# Patient Record
Sex: Female | Born: 1955 | Race: White | Hispanic: No | Marital: Married | Smoking: Never smoker
Health system: Southern US, Community
[De-identification: ages and names within clinical notes are randomized; demographics above are authoritative.]

## PROBLEM LIST (undated history)

## (undated) DIAGNOSIS — Z860101 Personal history of adenomatous and serrated colon polyps: Secondary | ICD-10-CM

## (undated) DIAGNOSIS — Z8719 Personal history of other diseases of the digestive system: Secondary | ICD-10-CM

## (undated) DIAGNOSIS — J382 Nodules of vocal cords: Secondary | ICD-10-CM

## (undated) DIAGNOSIS — J342 Deviated nasal septum: Secondary | ICD-10-CM

## (undated) DIAGNOSIS — H40009 Preglaucoma, unspecified, unspecified eye: Secondary | ICD-10-CM

## (undated) DIAGNOSIS — I10 Essential (primary) hypertension: Secondary | ICD-10-CM

## (undated) DIAGNOSIS — N6019 Diffuse cystic mastopathy of unspecified breast: Secondary | ICD-10-CM

## (undated) DIAGNOSIS — N840 Polyp of corpus uteri: Secondary | ICD-10-CM

## (undated) DIAGNOSIS — Z86010 Personal history of colonic polyps: Secondary | ICD-10-CM

## (undated) DIAGNOSIS — H40003 Preglaucoma, unspecified, bilateral: Secondary | ICD-10-CM

## (undated) DIAGNOSIS — M1990 Unspecified osteoarthritis, unspecified site: Secondary | ICD-10-CM

## (undated) DIAGNOSIS — K219 Gastro-esophageal reflux disease without esophagitis: Secondary | ICD-10-CM

## (undated) HISTORY — PX: OTHER SURGICAL HISTORY: SHX169

## (undated) HISTORY — PX: ESOPHAGOGASTRODUODENOSCOPY: SHX1529

## (undated) HISTORY — DX: Diffuse cystic mastopathy of unspecified breast: N60.19

## (undated) HISTORY — PX: TONSILLECTOMY: SUR1361

## (undated) HISTORY — DX: Polyp of corpus uteri: N84.0

## (undated) HISTORY — PX: COLONOSCOPY: SHX174

## (undated) HISTORY — PX: NISSEN FUNDOPLICATION: SHX2091

## (undated) HISTORY — DX: Essential (primary) hypertension: I10

---

## 1998-06-17 ENCOUNTER — Other Ambulatory Visit: Admission: RE | Admit: 1998-06-17 | Discharge: 1998-06-17 | Payer: Self-pay | Admitting: Obstetrics and Gynecology

## 1999-06-22 ENCOUNTER — Other Ambulatory Visit: Admission: RE | Admit: 1999-06-22 | Discharge: 1999-06-22 | Payer: Self-pay | Admitting: Obstetrics and Gynecology

## 1999-09-16 ENCOUNTER — Ambulatory Visit (HOSPITAL_COMMUNITY): Admission: RE | Admit: 1999-09-16 | Discharge: 1999-09-16 | Payer: Self-pay | Admitting: Gastroenterology

## 2000-06-13 ENCOUNTER — Other Ambulatory Visit: Admission: RE | Admit: 2000-06-13 | Discharge: 2000-06-13 | Payer: Self-pay | Admitting: Obstetrics and Gynecology

## 2001-06-14 ENCOUNTER — Other Ambulatory Visit: Admission: RE | Admit: 2001-06-14 | Discharge: 2001-06-14 | Payer: Self-pay | Admitting: Obstetrics and Gynecology

## 2002-06-14 ENCOUNTER — Other Ambulatory Visit: Admission: RE | Admit: 2002-06-14 | Discharge: 2002-06-14 | Payer: Self-pay | Admitting: Obstetrics and Gynecology

## 2002-08-22 ENCOUNTER — Encounter: Admission: RE | Admit: 2002-08-22 | Discharge: 2002-08-22 | Payer: Self-pay | Admitting: Obstetrics and Gynecology

## 2002-08-22 ENCOUNTER — Encounter: Payer: Self-pay | Admitting: Obstetrics and Gynecology

## 2002-10-24 ENCOUNTER — Encounter: Admission: RE | Admit: 2002-10-24 | Discharge: 2002-10-24 | Payer: Self-pay | Admitting: Obstetrics and Gynecology

## 2002-12-23 ENCOUNTER — Encounter: Admission: RE | Admit: 2002-12-23 | Discharge: 2002-12-23 | Payer: Self-pay | Admitting: Obstetrics and Gynecology

## 2002-12-23 ENCOUNTER — Encounter: Payer: Self-pay | Admitting: Obstetrics and Gynecology

## 2003-06-17 ENCOUNTER — Other Ambulatory Visit: Admission: RE | Admit: 2003-06-17 | Discharge: 2003-06-17 | Payer: Self-pay | Admitting: Obstetrics and Gynecology

## 2003-07-01 ENCOUNTER — Encounter: Admission: RE | Admit: 2003-07-01 | Discharge: 2003-07-01 | Payer: Self-pay

## 2003-07-01 ENCOUNTER — Encounter: Payer: Self-pay | Admitting: Obstetrics and Gynecology

## 2003-11-04 ENCOUNTER — Other Ambulatory Visit: Admission: RE | Admit: 2003-11-04 | Discharge: 2003-11-04 | Payer: Self-pay | Admitting: Obstetrics and Gynecology

## 2003-12-22 ENCOUNTER — Encounter: Admission: RE | Admit: 2003-12-22 | Discharge: 2003-12-22 | Payer: Self-pay | Admitting: Obstetrics and Gynecology

## 2004-02-27 ENCOUNTER — Encounter: Payer: Self-pay | Admitting: Gastroenterology

## 2004-04-15 ENCOUNTER — Encounter (INDEPENDENT_AMBULATORY_CARE_PROVIDER_SITE_OTHER): Payer: Self-pay | Admitting: Specialist

## 2004-04-15 ENCOUNTER — Ambulatory Visit (HOSPITAL_BASED_OUTPATIENT_CLINIC_OR_DEPARTMENT_OTHER): Admission: RE | Admit: 2004-04-15 | Discharge: 2004-04-15 | Payer: Self-pay | Admitting: Obstetrics and Gynecology

## 2004-06-17 ENCOUNTER — Other Ambulatory Visit: Admission: RE | Admit: 2004-06-17 | Discharge: 2004-06-17 | Payer: Self-pay | Admitting: Obstetrics and Gynecology

## 2004-12-01 ENCOUNTER — Encounter: Admission: RE | Admit: 2004-12-01 | Discharge: 2004-12-01 | Payer: Self-pay | Admitting: Obstetrics and Gynecology

## 2005-03-24 ENCOUNTER — Ambulatory Visit: Payer: Self-pay | Admitting: Gastroenterology

## 2005-06-20 ENCOUNTER — Other Ambulatory Visit: Admission: RE | Admit: 2005-06-20 | Discharge: 2005-06-20 | Payer: Self-pay | Admitting: Addiction Medicine

## 2005-07-18 ENCOUNTER — Encounter: Admission: RE | Admit: 2005-07-18 | Discharge: 2005-07-18 | Payer: Self-pay | Admitting: Obstetrics and Gynecology

## 2005-12-12 ENCOUNTER — Ambulatory Visit: Payer: Self-pay | Admitting: Gastroenterology

## 2005-12-14 ENCOUNTER — Encounter: Admission: RE | Admit: 2005-12-14 | Discharge: 2005-12-14 | Payer: Self-pay | Admitting: Obstetrics and Gynecology

## 2006-06-22 ENCOUNTER — Other Ambulatory Visit: Admission: RE | Admit: 2006-06-22 | Discharge: 2006-06-22 | Payer: Self-pay | Admitting: Obstetrics and Gynecology

## 2006-07-06 ENCOUNTER — Encounter: Admission: RE | Admit: 2006-07-06 | Discharge: 2006-07-06 | Payer: Self-pay | Admitting: Obstetrics and Gynecology

## 2006-12-15 ENCOUNTER — Encounter: Admission: RE | Admit: 2006-12-15 | Discharge: 2006-12-15 | Payer: Self-pay | Admitting: Obstetrics and Gynecology

## 2007-07-04 ENCOUNTER — Other Ambulatory Visit: Admission: RE | Admit: 2007-07-04 | Discharge: 2007-07-04 | Payer: Self-pay | Admitting: Obstetrics and Gynecology

## 2007-12-25 ENCOUNTER — Encounter: Admission: RE | Admit: 2007-12-25 | Discharge: 2007-12-25 | Payer: Self-pay | Admitting: Obstetrics and Gynecology

## 2007-12-26 ENCOUNTER — Encounter: Admission: RE | Admit: 2007-12-26 | Discharge: 2007-12-26 | Payer: Self-pay | Admitting: Obstetrics and Gynecology

## 2008-07-03 ENCOUNTER — Ambulatory Visit: Payer: Self-pay | Admitting: Gastroenterology

## 2008-07-03 DIAGNOSIS — K219 Gastro-esophageal reflux disease without esophagitis: Secondary | ICD-10-CM

## 2008-07-03 DIAGNOSIS — K3189 Other diseases of stomach and duodenum: Secondary | ICD-10-CM

## 2008-07-03 DIAGNOSIS — R1013 Epigastric pain: Secondary | ICD-10-CM

## 2008-07-03 DIAGNOSIS — Z860100 Personal history of colon polyps, unspecified: Secondary | ICD-10-CM | POA: Insufficient documentation

## 2008-07-03 DIAGNOSIS — K648 Other hemorrhoids: Secondary | ICD-10-CM | POA: Insufficient documentation

## 2008-07-03 DIAGNOSIS — F411 Generalized anxiety disorder: Secondary | ICD-10-CM | POA: Insufficient documentation

## 2008-07-04 ENCOUNTER — Telehealth: Payer: Self-pay | Admitting: Gastroenterology

## 2008-07-08 ENCOUNTER — Other Ambulatory Visit: Admission: RE | Admit: 2008-07-08 | Discharge: 2008-07-08 | Payer: Self-pay | Admitting: Obstetrics and Gynecology

## 2008-11-04 ENCOUNTER — Ambulatory Visit: Payer: Self-pay | Admitting: Obstetrics and Gynecology

## 2008-12-23 ENCOUNTER — Ambulatory Visit: Payer: Self-pay | Admitting: Obstetrics and Gynecology

## 2008-12-29 ENCOUNTER — Encounter: Admission: RE | Admit: 2008-12-29 | Discharge: 2008-12-29 | Payer: Self-pay | Admitting: Obstetrics and Gynecology

## 2009-02-18 ENCOUNTER — Encounter (INDEPENDENT_AMBULATORY_CARE_PROVIDER_SITE_OTHER): Payer: Self-pay | Admitting: *Deleted

## 2009-07-09 ENCOUNTER — Encounter: Payer: Self-pay | Admitting: Obstetrics and Gynecology

## 2009-07-09 ENCOUNTER — Other Ambulatory Visit: Admission: RE | Admit: 2009-07-09 | Discharge: 2009-07-09 | Payer: Self-pay | Admitting: Obstetrics and Gynecology

## 2009-07-09 ENCOUNTER — Ambulatory Visit: Payer: Self-pay | Admitting: Obstetrics and Gynecology

## 2009-07-27 ENCOUNTER — Ambulatory Visit: Payer: Self-pay | Admitting: Obstetrics and Gynecology

## 2009-08-25 ENCOUNTER — Ambulatory Visit: Payer: Self-pay | Admitting: Obstetrics and Gynecology

## 2009-11-24 ENCOUNTER — Ambulatory Visit: Payer: Self-pay | Admitting: Obstetrics and Gynecology

## 2010-01-14 ENCOUNTER — Encounter: Admission: RE | Admit: 2010-01-14 | Discharge: 2010-01-14 | Payer: Self-pay | Admitting: Obstetrics and Gynecology

## 2010-02-23 ENCOUNTER — Ambulatory Visit: Payer: Self-pay | Admitting: Obstetrics and Gynecology

## 2010-03-10 ENCOUNTER — Ambulatory Visit: Payer: Self-pay | Admitting: Obstetrics and Gynecology

## 2010-07-09 ENCOUNTER — Telehealth: Payer: Self-pay | Admitting: Gastroenterology

## 2010-07-27 ENCOUNTER — Ambulatory Visit: Payer: Self-pay | Admitting: Obstetrics and Gynecology

## 2010-07-27 ENCOUNTER — Other Ambulatory Visit: Admission: RE | Admit: 2010-07-27 | Discharge: 2010-07-27 | Payer: Self-pay | Admitting: Obstetrics and Gynecology

## 2011-01-15 ENCOUNTER — Other Ambulatory Visit: Payer: Self-pay | Admitting: Obstetrics and Gynecology

## 2011-01-15 DIAGNOSIS — Z1239 Encounter for other screening for malignant neoplasm of breast: Secondary | ICD-10-CM

## 2011-01-15 DIAGNOSIS — Z1231 Encounter for screening mammogram for malignant neoplasm of breast: Secondary | ICD-10-CM

## 2011-01-16 ENCOUNTER — Encounter: Payer: Self-pay | Admitting: Gastroenterology

## 2011-01-16 ENCOUNTER — Encounter: Payer: Self-pay | Admitting: Obstetrics and Gynecology

## 2011-01-25 NOTE — Progress Notes (Signed)
Summary: Schedule Colonoscopy or ECL  Phone Note Outgoing Call Call back at [facility] Phone [phone]   Call placed by: Harlow Mares CMA Duncan Dull),  July 09, 2010 3:02 PM Call placed to: Patient Summary of Call: Left message on patients machine to call back, she cxed her ECL 06/2008. she is actully due for just a colon but it looks like she was approved for a ECL by [employer].  Initial call taken by: Harlow Mares CMA Duncan Dull),  July 09, 2010 3:04 PM  Follow-up for Phone Call        Left a message on the patient machine to call back and schedule a previsit and procedure with our office. A letter will be mailed to the patient.   Follow-up by: Harlow Mares CMA Duncan Dull),  July 16, 2010 4:32 PM

## 2011-02-02 ENCOUNTER — Ambulatory Visit
Admission: RE | Admit: 2011-02-02 | Discharge: 2011-02-02 | Disposition: A | Discharge status: Home / Self Care | Payer: BC Managed Care – PPO | Source: Ambulatory Visit | Attending: Obstetrics and Gynecology | Admitting: Obstetrics and Gynecology

## 2011-02-02 DIAGNOSIS — Z1231 Encounter for screening mammogram for malignant neoplasm of breast: Secondary | ICD-10-CM

## 2011-05-13 NOTE — Op Note (Signed)
NAME:  [name], [name]                          ACCOUNT NO.:  [id number]   MEDICAL RECORD NO.:  [id number]                   PATIENT TYPE:  AMB   LOCATION:  [facility]                                 FACILITY:  [facility]   PHYSICIAN:  Daniel L. Eda Paschal, M.D.           DATE OF BIRTH:  1956/10/19   DATE OF PROCEDURE:  04/15/2004  DATE OF DISCHARGE:                                 OPERATIVE REPORT   PREOPERATIVE DIAGNOSES:  Dysfunctional uterine bleeding, endometrial polyp.   POSTOPERATIVE DIAGNOSES:  Dysfunctional uterine bleeding, endometrial polyp.   OPERATION:  D&C hysteroscopy.   SURGEON:  Daniel L. Eda Paschal, M.D.   ANESTHESIA:  General.   INDICATIONS FOR PROCEDURE:  The patient is a 55 year old, gravida 2, para 2,  AB 0 with a 6-9 month history of menometrorrhagia. Saline infusion  hysterogram done in the office showed intrauterine cavity defects consistent  with endometrial polyp. As a result of this, she enters the hospital for  hysteroscopy, D&C and excision of polyp.   FINDINGS:  External and vaginal is normal, cervix is clean. The uterus is  retroverted, normal size and shape. There is first degree uterine descensus.  Adnexa are palpably normal.  At the time of hysteroscopy, the patient had  several small endometrial polyps coming off both lateral walls of the  fundus.  After they had been removed, the top of the fundus, tubal ostia,  anterior and posterior walls of the fundus, lower uterine segment and  endocervical canal were free of disease.   DESCRIPTION OF PROCEDURE:  After adequate general anesthesia, the patient  was placed in the dorsal lithotomy position, prepped and draped in the usual  sterile manner. A single tooth tenaculum was placed in the anterior lip of  the cervix, the cervix was dilated to a #31 Pratt dilator and then a  hysteroscopic examination was done using the operative hysteroscope using a  wire loop using 3% sorbitol to expand the intrauterine cavity.  A camera was  used for magnification. When we first entered the uterus, there was so much  artifact from the endometrial polyps and endometrium that I really could not  get a very good view to resect the polyps under direct visualization so the  hysteroscope was removed and a D&C was done with removal of several very  large endometrial polyps as well as endometrial curettings. They were sent  to pathology for tissue diagnosis. The patient was now rehysteroscoped. She  now had a clean cavity and therefore it was felt that all the pathology had  been removed. Pictures were taken after removing the polyps for  documentation. The procedure was terminated, fluid deficit was less than 100  mL, blood loss was less than 50 mL.  The patient left the operating room in  satisfactory condition.  Daniel L. Eda Paschal, M.D.    [name]  D:  04/15/2004  T:  04/16/2004  Job:  [id number]

## 2011-08-22 DIAGNOSIS — K635 Polyp of colon: Secondary | ICD-10-CM | POA: Insufficient documentation

## 2011-08-22 DIAGNOSIS — N840 Polyp of corpus uteri: Secondary | ICD-10-CM | POA: Insufficient documentation

## 2011-08-22 DIAGNOSIS — H409 Unspecified glaucoma: Secondary | ICD-10-CM | POA: Insufficient documentation

## 2011-08-31 ENCOUNTER — Encounter: Payer: BC Managed Care – PPO | Admitting: Obstetrics and Gynecology

## 2011-09-14 ENCOUNTER — Other Ambulatory Visit: Payer: Self-pay | Admitting: Dermatology

## 2011-09-23 ENCOUNTER — Other Ambulatory Visit: Payer: Self-pay | Admitting: *Deleted

## 2011-09-23 MED ORDER — ESTRADIOL 10 MCG VA TABS: ORAL_TABLET | VAGINAL | Status: DC

## 2011-10-04 ENCOUNTER — Encounter: Payer: Self-pay | Admitting: Obstetrics and Gynecology

## 2011-10-04 ENCOUNTER — Ambulatory Visit (INDEPENDENT_AMBULATORY_CARE_PROVIDER_SITE_OTHER): Payer: BC Managed Care – PPO | Admitting: Obstetrics and Gynecology

## 2011-10-04 ENCOUNTER — Other Ambulatory Visit (HOSPITAL_COMMUNITY)
Admission: RE | Admit: 2011-10-04 | Discharge: 2011-10-04 | Disposition: A | Discharge status: Home / Self Care | Payer: BC Managed Care – PPO | Source: Ambulatory Visit | Attending: Obstetrics and Gynecology | Admitting: Obstetrics and Gynecology

## 2011-10-04 VITALS — BP 120/76 | Ht 65.0 in | Wt 129.0 lb

## 2011-10-04 DIAGNOSIS — R82998 Other abnormal findings in urine: Secondary | ICD-10-CM

## 2011-10-04 DIAGNOSIS — N6019 Diffuse cystic mastopathy of unspecified breast: Secondary | ICD-10-CM | POA: Insufficient documentation

## 2011-10-04 DIAGNOSIS — Z01419 Encounter for gynecological examination (general) (routine) without abnormal findings: Secondary | ICD-10-CM | POA: Insufficient documentation

## 2011-10-04 DIAGNOSIS — N951 Menopausal and female climacteric states: Secondary | ICD-10-CM

## 2011-10-04 DIAGNOSIS — R1032 Left lower quadrant pain: Secondary | ICD-10-CM

## 2011-10-04 DIAGNOSIS — M810 Age-related osteoporosis without current pathological fracture: Secondary | ICD-10-CM

## 2011-10-04 DIAGNOSIS — N952 Postmenopausal atrophic vaginitis: Secondary | ICD-10-CM

## 2011-10-04 DIAGNOSIS — Z780 Asymptomatic menopausal state: Secondary | ICD-10-CM

## 2011-10-04 MED ORDER — ALPRAZOLAM 0.25 MG PO TABS: 0.2500 mg | ORAL_TABLET | Freq: Every evening | ORAL | Status: AC | PRN

## 2011-10-04 NOTE — Progress Notes (Signed)
Patient came today for her annual GYN exam. She says her Vagifem is doing very well. She is not taking HRT and is all right without it. She has osteoporosis and has been on by biphosphonates since July, 2008. She is up-to-date on mammograms. She is due for a followup bone density to be sure she continues to respond to her medication. She does her lab through her PCP.  Physical examination: HEENT within normal limits. Neck: Thyroid not large. No masses. Supraclavicular nodes: not enlarged. Breasts: Examined in both sitting midline position. No skin changes and no masses. Abdomen: Soft no guarding rebound or masses or hernia. Pelvic: External: Within normal limits. BUS: Within normal limits. Vaginal:within normal limits. Good estrogen effect. No evidence of cystocele rectocele or enterocele. Cervix: clean. Uterus: Normal size and shape. Adnexa: No masses. Rectovaginal exam: Confirmatory and negative. Extremities: Within normal limits.  Assessment: #1. Atrophic vaginitis #2. Osteoporosis  Plan: Continue Boniva and Vagifem. Bone density. Continue yearly mammograms. Continue alprazolam prescription written.

## 2011-10-13 ENCOUNTER — Ambulatory Visit (INDEPENDENT_AMBULATORY_CARE_PROVIDER_SITE_OTHER): Payer: BC Managed Care – PPO

## 2011-10-13 DIAGNOSIS — M810 Age-related osteoporosis without current pathological fracture: Secondary | ICD-10-CM

## 2011-10-18 ENCOUNTER — Other Ambulatory Visit: Payer: Self-pay | Admitting: *Deleted

## 2011-10-18 MED ORDER — IBANDRONATE SODIUM 150 MG PO TABS: 150.0000 mg | ORAL_TABLET | ORAL | Status: DC

## 2011-10-20 ENCOUNTER — Ambulatory Visit (INDEPENDENT_AMBULATORY_CARE_PROVIDER_SITE_OTHER): Payer: BC Managed Care – PPO | Admitting: Obstetrics and Gynecology

## 2011-10-20 ENCOUNTER — Ambulatory Visit (INDEPENDENT_AMBULATORY_CARE_PROVIDER_SITE_OTHER): Payer: BC Managed Care – PPO

## 2011-10-20 DIAGNOSIS — R1032 Left lower quadrant pain: Secondary | ICD-10-CM

## 2011-10-20 DIAGNOSIS — M810 Age-related osteoporosis without current pathological fracture: Secondary | ICD-10-CM

## 2011-10-20 DIAGNOSIS — M899 Disorder of bone, unspecified: Secondary | ICD-10-CM

## 2011-10-20 DIAGNOSIS — M8580 Other specified disorders of bone density and structure, unspecified site: Secondary | ICD-10-CM

## 2011-10-20 DIAGNOSIS — L293 Anogenital pruritus, unspecified: Secondary | ICD-10-CM

## 2011-10-20 DIAGNOSIS — N898 Other specified noninflammatory disorders of vagina: Secondary | ICD-10-CM

## 2011-10-20 DIAGNOSIS — N6019 Diffuse cystic mastopathy of unspecified breast: Secondary | ICD-10-CM

## 2011-10-20 MED ORDER — IBANDRONATE SODIUM 150 MG PO TABS: 150.0000 mg | ORAL_TABLET | ORAL | Status: AC

## 2011-10-20 MED ORDER — FLUCONAZOLE 200 MG PO TABS: 200.0000 mg | ORAL_TABLET | Freq: Every day | ORAL | Status: AC

## 2011-10-20 NOTE — Progress Notes (Signed)
The patient came in today for a multi-problem visit. The first problem was that she's had intermittent left lower quadrant pain. She says it can come monthly or every other month and feels like she is ovulating. She is having no vaginal bleeding with it. She has a history of ovarian cysts. She is having no nausea vomiting or change in her bowel habits. She is having no dysuria frequency or hematuria.  Assessment: Left lower quadrant pain  Plan: Pelvic ultrasound done. Patient's uterus is anteverted and has a heterogeneous echo pattern consistent with adenomyosis. Her right ovary is normal. Her left ovary is normal. Her cul-de-sac is free of fluid. She was reassured and we will monitor this pain.  The second problem was that she's had some vulvar and vaginal itching and wanted to she had a yeast infection. She's also had some vaginal discharge.  Pelvic exam: External: Within normal limits. BUS: Within normal limits. Vaginal exam: Patient has decreased estrogen effect. Wet prep positive for yeast.  Assessment: Yeast vaginitis  Plan: Diflucan 200 mg daily for 3 days.  The third problem is that patient is very worried about her breasts because of a breast cyst it's been watched on mammography. She is felt nothing herself on self-exam. She did not think I examine her breasts lying down but just  sitting at her recent annual exam. I reassured that I examined her in both positions. However we went ahead and did a breast exam. Kennon Portela was present both for the breast exam and the pelvic exam.both her breasts were carefully examined in both the sitting and lying position. There are no skin changes. There no dominant lesions.  Assessment: Fibrocystic breast disease  Plan: Patient reassured that she is a normal breast exam.  Her fourth problem is that her recent bone density here showed significant loss of bone in both the spine and hip. She does have osteoporosis. She was originally treated with  Actonel. She was which by her insurance company to [employer]. We discussed that I thought Actonel was better coverage. We will check her for secondary causes a bone loss. We discussed either switch her back to Actonel or using Prolia or Reclast. We will make that decision after we see her lab work.

## 2011-10-21 LAB — GLIA (IGA/G) + TTG IGA: Gliadin IgA: 8.6 U/mL (ref <20)

## 2011-10-26 ENCOUNTER — Other Ambulatory Visit: Payer: Self-pay | Admitting: *Deleted

## 2011-10-26 DIAGNOSIS — M810 Age-related osteoporosis without current pathological fracture: Secondary | ICD-10-CM

## 2011-10-27 LAB — CALCIUM, URINE, 24 HOUR: Calcium, Ur: 3 mg/dL

## 2011-10-27 LAB — CREATININE, URINE, 24 HOUR: Creatinine, 24H Ur: 1030 mg/d (ref 700–1800)

## 2011-11-11 ENCOUNTER — Other Ambulatory Visit: Payer: Self-pay | Admitting: *Deleted

## 2011-11-11 DIAGNOSIS — M810 Age-related osteoporosis without current pathological fracture: Secondary | ICD-10-CM

## 2011-11-11 MED ORDER — DENOSUMAB 60 MG/ML ~~LOC~~ SOLN
60.0000 mg | Freq: Once | SUBCUTANEOUS | Status: AC
  Administered 2011-11-30: 60 mg via SUBCUTANEOUS

## 2011-11-11 NOTE — Progress Notes (Signed)
Patient informed benefits for Prolia.  She would have a $70 copay.  Will order and have patient come in for injection on Dec 5th @2 :30pm.

## 2011-11-14 ENCOUNTER — Telehealth: Payer: Self-pay | Admitting: *Deleted

## 2011-11-14 NOTE — Telephone Encounter (Signed)
Patient was informed Prolia benefits are $70 copay.  Ordered Prolia and set up appointment to inject.

## 2011-11-14 NOTE — Telephone Encounter (Signed)
Message copied by Libby Maw on Mon Nov 14, 2011  2:10 PM ------      Message from: Trellis Paganini      Created: Thu Oct 27, 2011  9:02 AM       I send you a result note on the above patient. Please get her approved for Prolia. She has osteoporosis and has been losing significant bone on Boniva. Thank you.

## 2011-11-30 ENCOUNTER — Ambulatory Visit (INDEPENDENT_AMBULATORY_CARE_PROVIDER_SITE_OTHER): Payer: BC Managed Care – PPO | Admitting: Anesthesiology

## 2011-11-30 DIAGNOSIS — M810 Age-related osteoporosis without current pathological fracture: Secondary | ICD-10-CM

## 2012-02-02 ENCOUNTER — Other Ambulatory Visit: Payer: Self-pay | Admitting: Obstetrics and Gynecology

## 2012-02-02 DIAGNOSIS — Z1231 Encounter for screening mammogram for malignant neoplasm of breast: Secondary | ICD-10-CM

## 2012-02-09 ENCOUNTER — Ambulatory Visit (INDEPENDENT_AMBULATORY_CARE_PROVIDER_SITE_OTHER): Payer: BC Managed Care – PPO | Admitting: Obstetrics and Gynecology

## 2012-02-09 DIAGNOSIS — N898 Other specified noninflammatory disorders of vagina: Secondary | ICD-10-CM

## 2012-02-09 DIAGNOSIS — N760 Acute vaginitis: Secondary | ICD-10-CM

## 2012-02-09 DIAGNOSIS — B9689 Other specified bacterial agents as the cause of diseases classified elsewhere: Secondary | ICD-10-CM

## 2012-02-09 DIAGNOSIS — A499 Bacterial infection, unspecified: Secondary | ICD-10-CM

## 2012-02-09 LAB — WET PREP FOR TRICH, YEAST, CLUE: Clue Cells Wet Prep HPF POC: NONE SEEN

## 2012-02-09 MED ORDER — ESTRADIOL 10 MCG VA TABS: ORAL_TABLET | VAGINAL | Status: DC

## 2012-02-09 MED ORDER — METRONIDAZOLE 0.75 % VA GEL: 1.0000 | Freq: Every day | VAGINAL | Status: AC

## 2012-02-09 NOTE — Progress Notes (Signed)
Patient came in today with a 3 week history of vaginal discharge with a little itching and odor. She tried 3 Diflucan without success. She continues use Vagifem and has to use it more frequently to get good results. She needs a refill.  Exam: Kennon Portela present. External: No evidence of vulvitis. BUS: Within normal limits. Vaginal exam: Frothy discharge present. Cervix: Within normal limits. Wet prep negative for yeast, Trichomonas, clue cells. Many white blood cells and bacteria present.  Assessment: Bacterial vaginosis  Plan: MetroGel vaginal cream one applicatorful at bedtime in her vagina for 5 days. Told her to call me if not completely gone and we may add Gynazole 1 cream. Vagifem refilled.

## 2012-03-01 ENCOUNTER — Ambulatory Visit
Admission: RE | Admit: 2012-03-01 | Discharge: 2012-03-01 | Disposition: A | Discharge status: Home / Self Care | Payer: BC Managed Care – PPO | Source: Ambulatory Visit | Attending: Obstetrics and Gynecology | Admitting: Obstetrics and Gynecology

## 2012-03-01 DIAGNOSIS — Z1231 Encounter for screening mammogram for malignant neoplasm of breast: Secondary | ICD-10-CM

## 2012-03-06 ENCOUNTER — Other Ambulatory Visit: Payer: Self-pay | Admitting: Internal Medicine

## 2012-03-06 DIAGNOSIS — M5412 Radiculopathy, cervical region: Secondary | ICD-10-CM

## 2012-03-11 ENCOUNTER — Other Ambulatory Visit: Payer: BC Managed Care – PPO

## 2012-04-11 ENCOUNTER — Ambulatory Visit (HOSPITAL_COMMUNITY)
Admission: RE | Admit: 2012-04-11 | Discharge: 2012-04-11 | Disposition: A | Discharge status: Home / Self Care | Payer: BC Managed Care – PPO | Source: Ambulatory Visit | Attending: Neurosurgery | Admitting: Neurosurgery

## 2012-04-11 DIAGNOSIS — M542 Cervicalgia: Secondary | ICD-10-CM | POA: Insufficient documentation

## 2012-04-11 DIAGNOSIS — IMO0001 Reserved for inherently not codable concepts without codable children: Secondary | ICD-10-CM | POA: Insufficient documentation

## 2012-04-11 DIAGNOSIS — M6281 Muscle weakness (generalized): Secondary | ICD-10-CM | POA: Insufficient documentation

## 2012-04-12 DIAGNOSIS — M542 Cervicalgia: Secondary | ICD-10-CM | POA: Insufficient documentation

## 2012-04-12 NOTE — Evaluation (Signed)
Physical Therapy Evaluation  Patient Details  Name: [name] MRN: [id number] Date of Birth: [birth year]  Today's Date: 04/11/2012 Time: 7829-5621 Time Calculation (min): 50 min Charges: 1 eval Visit#: 1  of 8   Re-eval: 05/12/12    Past Medical History:  Past Medical History  Diagnosis Date  . Endometrial polyp   . Osteoporosis   . Colon polyps     Benign  . Hypertension   . Breast fibrocystic disorder    Past Surgical History:  Past Surgical History  Procedure Date  . Hysteroscopy 2005  . Dilation and curettage of uterus 2005  . Nasal septum surgery   . Tonsillectomy   . Cesarean section     X 2  . Nissen fundoplication     Subjective Symptoms/Limitations Symptoms: Pt reports that the week of Feb 17th she was participating in her workout activities and was cleaning her home intensely for an entire week to get ready for a party when she started to notice sharp stabbing pains to the back of her neck. She went to her PCP (Dr. Ouida Sills) who prescribed muscle relaxors and pain relievers which helped some. She was then referred to Dr. Dutch Quint who performed an MRI and found she has increased spuring at C5-6 w/disc degeneration and foraminal narrowing. She reports after she sought help from Dr. Dutch Quint and felt comfortable to start walking again her neck pain started to decrease, however she continues to have increased numbness and tingling to her LUE which is felt the worst into her hand. She denies recent HA, however reports that 2 years ago she sought help from a chiropractor to help with HA and feels that some of the manipulation may have led to her current state. PMH: Osteoporosis  How long can you sit comfortably?: Increased numbness, especially with driving How long can you walk comfortably?: Increased pain when she is at her body flex class Special Tests: + Distraction, + Spurlings compression  Pain Assessment Currently in Pain?: Yes Pain Score:   4 (more complaints of  tingling and numbness) Pain Location: Neck   04/11/12 1408 Assessment Diagnosis Cervical Pain Next MD Visit Dr. 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  However when seated demonstrates mild Bilateral scapular dyskinesis  Cervical AROM Flexion   WNL, has increased pain Extension  decreased 10% (w/L SB has increased numbness) Right Side Bend Decreased 40% Left Side Bend Decreased 40% Right Rotation  WNL Left Rotation  Decreased 25% Cervical Strength Flexion   3+/5 Extension  4/5 Right Side Bend 4/5 Left Side Bend 4/5 Right Rotation  5/5 Left Rotation  4/5  Palpation: increaed pain and tenderness to L UT region with signficant trigger point.  Increaed UT activiation with initiation of shoulder movement    Exercise/Treatments Neck Exercises Neck Retraction: 5 reps (10 sec holds) Shoulder Extension: Limitations Shoulder Extension Limitations: Scap retraction 5x10 sec holds  Manual Therapy Manual Therapy: Other (comment) Other Manual Therapy: Manual cervical traction 3x1 minute holds - reports decreased numbness and tingling  Physical Therapy Assessment and Plan PT Assessment and Plan Clinical Impression Statement: Pt is a 56 year old female referred to PT secondary to cervicalgia.  After examination it was  found she has current impairments including decreased cervical ROM, increased L cervical tingling, numbness and intermittent pain, decreased cervical strength, cervical and scapular spasms, poor motor control to cervical stabilizers, mild upper cross syndrome and reports of decreased exercise activities secondary to increased pain.  Pt will  benefit from skilled OP PT in order to address above impairments in order to reach functional goals.  Rehab Potential: Good PT Frequency: Min 2X/week PT Duration: 4 weeks PT Treatment/Interventions: Functional mobility training;Therapeutic activities;Therapeutic exercise;Neuromuscular re-education;Patient/family education;Other (comment) (Manual techniques, vertebral joint mobs, modalities for pain) PT Plan: attempt cervical traction static hold starting at 12-13 pounds.  focus on cervical core stability after cervical traction and decreasing pain.     Goals Home Exercise Program Pt will Perform Home Exercise Program: Independently PT Short Term Goals Time to Complete Short Term Goals: 4 weeks PT Short Term Goal 1: Pt will improve her cervical strength and stability in order to tolerate attending her power flex class PT Short Term Goal 2: Pt will improve her cervical AROM to WNL w/o reports of increased numbness and tingling. PT Short Term Goal 3: Pt will report pain less than 2/10 for 75% of her day for improved QOL. PT Short Term Goal 4: Pt will present with decreased muscular spasms to UT and L scapular region.   Problem List Patient Active Problem List  Diagnoses  . ANXIETY STATE, UNSPECIFIED  . INTERNAL HEMORRHOIDS  . ESOPHAGEAL REFLUX  . DYSPEPSIA  . COLONIC POLYPS, ADENOMATOUS, HX OF  . Endometrial polyp  . Osteoporosis  . Colon polyps  . Glaucoma  . Breast fibrocystic disorder  . Cervicalgia    PT Plan of Care PT Home Exercise Plan: chin tucks with scap retraction for posture Consulted and Agree with Plan of Care: Patient   Annett Fabian, PT 04/12/2012, 1:17 PM  Physician Documentation Your signature is required to indicate approval of the treatment plan as stated above.  Please sign and either send electronically or make a copy of this report for your files and return this physician signed original.   Please mark one 1.__approve of plan  2. ___approve of plan with the  following conditions.   ______________________________                                                          _____________________ Physician Signature                                                                                                             Date

## 2012-04-17 ENCOUNTER — Ambulatory Visit (HOSPITAL_COMMUNITY)
Admission: RE | Admit: 2012-04-17 | Discharge: 2012-04-17 | Disposition: A | Discharge status: Home / Self Care | Payer: BC Managed Care – PPO | Source: Ambulatory Visit | Attending: Physical Therapy | Admitting: Physical Therapy

## 2012-04-17 NOTE — Progress Notes (Signed)
Physical Therapy Treatment Patient Details  Name: [name] MRN: [id number] Date of Birth: 1956/12/02  Today's Date: 04/17/2012 Time: 2130-8657 Time Calculation (min): 55 min Charges:1 traction, 15' Manual,  8' TE Visit#: 2  of 8   Re-eval: 05/12/12    Subjective: Symptoms/Limitations Symptoms: Pt reports that she has been having some tingling with chin tucks, but not with scap retractions. Reports she just got back from a beach vacation and notices weakness when lifting her luggage in her L arm.  Pain Assessment Currently in Pain?: No/denies Pain Score:  (Has tingling to her hand)  Exercise/Treatments Neck Exercises Neck Retraction:  (Held secondary to increased tingling) Shoulder Extension Limitations: Scap retraction 5x10 sec holds; Green theraputy squeeze 3x, Red rubber band: 5 finger extension x10; each finger opposition x5 each finger Additional Neck Exercises UBE (Upper Arm Bike): 4 minutes backwards w/cueing for proper posture  Modalities Modalities: Traction Manual Therapy Manual Therapy: Other (comment) Other Manual Therapy: Nerve glides with focus on L shoulder depression: with shoulder abduction, elbow flexion and extension, pronation and supination and head turing to the R.  Initally numbness into finger tips. At the end numbness to proximal volar surface of hand near wrist. Traction Type of Traction: Cervical Min (lbs): 15 minutes Max (lbs): 13 lbs Hold Time: Static x15 minutes  Physical Therapy Assessment and Plan PT Assessment and Plan Clinical Impression Statement: Today's treatment focus was to decrease tingling to LUEand improve posture and hand strength.  Reports decreased numbness after traction and manual techniques were performed while in the seated position. PT Plan: f/u on traction and nerve glides.    Goals    Problem List Patient Active Problem List  Diagnoses  . ANXIETY STATE, UNSPECIFIED  . INTERNAL HEMORRHOIDS  . ESOPHAGEAL REFLUX    . DYSPEPSIA  . COLONIC POLYPS, ADENOMATOUS, HX OF  . Endometrial polyp  . Osteoporosis  . Colon polyps  . Glaucoma  . Breast fibrocystic disorder  . Cervicalgia    PT Plan of Care PT Home Exercise Plan: Gave pt green putty and red rubber band for HEP (finger flexion and grip strength) Consulted and Agree with Plan of Care: Patient  GP No functional reporting required  [name] 04/17/2012, 12:16 PM

## 2012-04-19 ENCOUNTER — Ambulatory Visit (HOSPITAL_COMMUNITY)
Admission: RE | Admit: 2012-04-19 | Discharge: 2012-04-19 | Disposition: A | Discharge status: Home / Self Care | Payer: BC Managed Care – PPO | Source: Ambulatory Visit | Attending: Internal Medicine | Admitting: Internal Medicine

## 2012-04-23 NOTE — Progress Notes (Signed)
Physical Therapy Treatment Patient Details  Name: [name] MRN: [id number] Date of Birth: 1956-03-14  Today's Date: 04/23/2012 Time: 4098-1191 Time Calculation (min): 40 min Charges:  Visit#: 3  of 8   Re-eval: 05/12/12    Subjective: Symptoms/Limitations Symptoms: Pt states that she is not having any pain just numbness in L hand. She states she could not get comfortable last night lying on either side Pain Assessment Currently in Pain?: No/denies  Precautions/Restrictions     Exercise/Treatments Education: on proper body mechanics for gardening, house cleaning, washing dishes. Explained importance of muscle structures and appropriate mechanics and posture that does not cause increased radicular pain.  Modalities Modalities: Traction Traction Type of Traction: Cervical Min (lbs): 0 Max (lbs): 13 Hold Time: Static Time: 15  Physical Therapy Assessment and Plan PT Assessment and Plan Clinical Impression Statement: Pt had decreased nerve pain after cervical traction and did not require nerve glides.  Most of the treatment consisted of education pt in the proper position for gardening, house cleaning with scap retraction and chin tucks to the point that they do not cause radicular pain.  Pt continues to have some radicular pain with chin tucks at end range.   PT Plan: Cont to improve cervical stability through exercise    Goals    Problem List Patient Active Problem List  Diagnoses  . ANXIETY STATE, UNSPECIFIED  . INTERNAL HEMORRHOIDS  . ESOPHAGEAL REFLUX  . DYSPEPSIA  . COLONIC POLYPS, ADENOMATOUS, HX OF  . Endometrial polyp  . Osteoporosis  . Colon polyps  . Glaucoma  . Breast fibrocystic disorder  . Cervicalgia       GP No functional reporting required  Jaisha Villacres 04/23/2012, 1:38 PM

## 2012-04-24 ENCOUNTER — Ambulatory Visit (HOSPITAL_COMMUNITY)
Admission: RE | Admit: 2012-04-24 | Discharge: 2012-04-24 | Disposition: A | Discharge status: Home / Self Care | Payer: BC Managed Care – PPO | Source: Ambulatory Visit | Attending: Internal Medicine | Admitting: Internal Medicine

## 2012-04-24 NOTE — Progress Notes (Signed)
Physical Therapy Treatment Patient Details  Name: [name] MRN: [id number] Date of Birth: 29-Nov-1956  Today's Date: 04/24/2012 Time: 1118-1205 Time Calculation (min): 47 min Charges: 15 TE, 33' Self Care Visit#: 4  of 8   Re-eval: 05/12/12    Subjective: Symptoms/Limitations Symptoms: Pt reports that she continues to report that she has intermittent numbness in her L shoulder, c/co is numbness to her L hand.  Pain Assessment Currently in Pain?: No/denies  Precautions/Restrictions     Exercise/Treatments Other Exercises: Cervical Retraction and Scap retraction: W/all ther-ex today when discussing activities she performs at body flex.  Squats 3# weights, Lunges 3# weights, Chest press 3# dowel, Flies 3# weights, supine shoulder flexion 3# weights, Quadruped w/elbows on 6 in step hip extension w/L hand towel squeeze (BLE); supine cruches, pilates 100's, standing quick shoulder flexion, abduction and extension.  Mod Cueing for proper position for activities Other Seated Exercises: Hand Exercises: Red Hand Squeeze (each finger 5x5 sec holds); Red Gripper full hand grip 5x5 sec hold; Green Putty: Key Turns x3 minutes, Composit grip x10, finger flexion 5x5 sec holds ROM / Strengthening / Isometric Strengthening UBE (Upper Arm Bike): 4 min backwards for posture   Physical Therapy Assessment and Plan PT Assessment and Plan Clinical Impression Statement: Today's treatment focus was to discuss proper posture with body flex activities and pt would demonstrate between 3-15 reps of each as well as education on proper ways to avoid radiating pain.  Also provided pt w/increased hand strengthening exercises.  Pt brought in home pillow to discuss proper body position and alignment. Continues to requrie mod cueing for proper alignment and techniques to decrease radicular pain.  PT Plan: Possible D/C after next visit    Goals    Problem List Patient Active Problem List  Diagnoses  . ANXIETY  STATE, UNSPECIFIED  . INTERNAL HEMORRHOIDS  . ESOPHAGEAL REFLUX  . DYSPEPSIA  . COLONIC POLYPS, ADENOMATOUS, HX OF  . Endometrial polyp  . Osteoporosis  . Colon polyps  . Glaucoma  . Breast fibrocystic disorder  . Cervicalgia    PT - End of Session Activity Tolerance: Patient tolerated treatment well PT Plan of Care PT Patient Instructions: Thorough education on proper neck posture with sleeping, and body flex activities.  Recommend 6 inch prop to use w/POE activities at her work out class to improve her overall posture Consulted and Agree with Plan of Care: Patient  GP No functional reporting required  [name] 04/24/2012, 12:23 PM

## 2012-04-26 ENCOUNTER — Ambulatory Visit (HOSPITAL_COMMUNITY)
Admission: RE | Admit: 2012-04-26 | Discharge: 2012-04-26 | Disposition: A | Discharge status: Home / Self Care | Payer: BC Managed Care – PPO | Source: Ambulatory Visit | Attending: Internal Medicine | Admitting: Internal Medicine

## 2012-04-26 DIAGNOSIS — IMO0001 Reserved for inherently not codable concepts without codable children: Secondary | ICD-10-CM | POA: Insufficient documentation

## 2012-04-26 DIAGNOSIS — M542 Cervicalgia: Secondary | ICD-10-CM | POA: Insufficient documentation

## 2012-04-26 DIAGNOSIS — M6281 Muscle weakness (generalized): Secondary | ICD-10-CM | POA: Insufficient documentation

## 2012-04-27 NOTE — Progress Notes (Addendum)
Physical Therapy Treatment Patient Details  Name: [name] MRN: [id number] Date of Birth: 1956-03-27  Today's Date: 04/27/2012 Time: 0950-1035 PT Time Calculation (min): 45 min Charges: 30' Manual, 15' Self Care Visit#: 5  of 8   Re-eval: 05/12/12  Subjective: Symptoms/Limitations Symptoms: Pt states she continues to have numbness to her arm, but is able to control it.   Precautions/Restrictions     Exercise/Treatments Neck Exercises Neck Retraction: 10 reps;Supine;Other (comment) (w/alt shoulder flex x10 each) Neck Lateral Flexion - Right: AROM;5 reps Neck Lateral Flexion - Left: AROM;5 reps Neck Rotation - Right: AROM;5 reps Neck Rotation - Left: AROM;5 reps Scapular Retraction: Both;10 reps;Seated (w.chin tuck) Additional Neck Exercises    Manual Therapy Manual Therapy: Joint mobilization Joint Mobilization: Grade II-IV PA to C5-7 Other Manual Therapy: Manaual cervical traction w/joint mobs in supine position; L UE PROM shoulder flexion and abudction w/nerve glides to shoulder only  Physical Therapy Assessment and Plan PT Assessment and Plan Clinical Impression Statement: Treatment focused on manual techniques to rid pt of radicular pain with seated chin tucks.  Beginning of session pt had immediate radiculopathy with cervical extension, at end of treatment it took 17 secs for radiculopathy to appear w.cervical extension.  Pt able to maintain apporpriate posture and chin tucks without radicular pain.  PT Plan: Will trial for 2 weeks without therapy to see if she is able to maintain.     Goals    Problem List Patient Active Problem List  Diagnoses  . ANXIETY STATE, UNSPECIFIED  . INTERNAL HEMORRHOIDS  . ESOPHAGEAL REFLUX  . DYSPEPSIA  . COLONIC POLYPS, ADENOMATOUS, HX OF  . Endometrial polyp  . Osteoporosis  . Colon polyps  . Glaucoma  . Breast fibrocystic disorder  . Cervicalgia    PT - End of Session Activity Tolerance: Patient tolerated treatment  well PT Plan of Care PT Patient Instructions: Reviewed HEP for hand, chin tucks and scap retraction.  Educated on proper techniques for bending over to complete housework, answered questions reguarding work outs and weight machines.  Answered questions nerve pain and damage.   Consulted and Agree with Plan of Care: Patient  GP No functional reporting required  Pheobe Sandiford 04/27/2012, 10:10 AM

## 2012-05-01 ENCOUNTER — Ambulatory Visit (HOSPITAL_COMMUNITY): Payer: BC Managed Care – PPO | Admitting: Physical Therapy

## 2012-05-03 ENCOUNTER — Ambulatory Visit (HOSPITAL_COMMUNITY): Payer: BC Managed Care – PPO

## 2012-05-08 ENCOUNTER — Ambulatory Visit (HOSPITAL_COMMUNITY): Payer: BC Managed Care – PPO

## 2012-05-10 ENCOUNTER — Encounter: Payer: Self-pay | Admitting: Gastroenterology

## 2012-05-10 ENCOUNTER — Ambulatory Visit (HOSPITAL_COMMUNITY): Payer: BC Managed Care – PPO | Admitting: Physical Therapy

## 2012-05-31 ENCOUNTER — Telehealth: Payer: Self-pay | Admitting: *Deleted

## 2012-05-31 ENCOUNTER — Other Ambulatory Visit: Payer: Self-pay | Admitting: *Deleted

## 2012-05-31 DIAGNOSIS — M810 Age-related osteoporosis without current pathological fracture: Secondary | ICD-10-CM

## 2012-05-31 MED ORDER — DENOSUMAB 60 MG/ML ~~LOC~~ SOLN
60.0000 mg | Freq: Once | SUBCUTANEOUS | Status: AC
  Administered 2012-06-05: 60 mg via SUBCUTANEOUS

## 2012-05-31 NOTE — Telephone Encounter (Signed)
Patient informed Prolia covered 100%. Will come for injection.

## 2012-06-05 ENCOUNTER — Ambulatory Visit (INDEPENDENT_AMBULATORY_CARE_PROVIDER_SITE_OTHER): Payer: BC Managed Care – PPO | Admitting: Gynecology

## 2012-06-05 DIAGNOSIS — M810 Age-related osteoporosis without current pathological fracture: Secondary | ICD-10-CM

## 2012-07-16 ENCOUNTER — Other Ambulatory Visit: Payer: Self-pay | Admitting: Obstetrics and Gynecology

## 2012-07-16 ENCOUNTER — Other Ambulatory Visit: Payer: Self-pay | Admitting: Gynecology

## 2012-07-16 MED ORDER — ESTRADIOL 10 MCG VA TABS: ORAL_TABLET | VAGINAL | Status: DC

## 2012-11-13 ENCOUNTER — Telehealth: Payer: Self-pay | Admitting: *Deleted

## 2012-11-13 NOTE — Telephone Encounter (Signed)
I called pt she is due for her next shot in Dec. I need to know if she wants to continue and if her insurance is still the same. I left message at her home number. KW

## 2012-11-20 ENCOUNTER — Encounter: Payer: BC Managed Care – PPO | Admitting: Gynecology

## 2012-11-27 NOTE — Telephone Encounter (Signed)
I spoke with patient today. She is seeing JF tomorrow for her annual and will discuss it with him tomorrow.I iwill wait to hear from JF then [facility]

## 2012-11-28 ENCOUNTER — Ambulatory Visit (INDEPENDENT_AMBULATORY_CARE_PROVIDER_SITE_OTHER): Payer: BC Managed Care – PPO | Admitting: Gynecology

## 2012-11-28 ENCOUNTER — Encounter: Payer: Self-pay | Admitting: Gynecology

## 2012-11-28 VITALS — BP 120/70 | Ht 64.5 in | Wt 132.0 lb

## 2012-11-28 DIAGNOSIS — N952 Postmenopausal atrophic vaginitis: Secondary | ICD-10-CM

## 2012-11-28 DIAGNOSIS — Z1159 Encounter for screening for other viral diseases: Secondary | ICD-10-CM

## 2012-11-28 DIAGNOSIS — Z01419 Encounter for gynecological examination (general) (routine) without abnormal findings: Secondary | ICD-10-CM

## 2012-11-28 DIAGNOSIS — Z8742 Personal history of other diseases of the female genital tract: Secondary | ICD-10-CM

## 2012-11-28 DIAGNOSIS — M810 Age-related osteoporosis without current pathological fracture: Secondary | ICD-10-CM

## 2012-11-28 NOTE — Patient Instructions (Addendum)
Shingles Vaccine What You Need to Know WHAT IS SHINGLES?  Shingles is a painful skin rash, often with blisters. It is also called Herpes Zoster or just Zoster.  A shingles rash usually appears on one side of the face or body and lasts from 2 to 4 weeks. Its main symptom is pain, which can be quite severe. Other symptoms of shingles can include fever, headache, chills, and upset stomach. Very rarely, a shingles infection can lead to pneumonia, hearing problems, blindness, brain inflammation (encephalitis), or death.  For about 1 person in 5, severe pain can continue even after the rash clears up. This is called post-herpetic neuralgia.  Shingles is caused by the Varicella Zoster virus. This is the same virus that causes chickenpox. Only someone who has had a case of chickenpox or rarely, has gotten chickenpox vaccine, can get shingles. The virus stays in your body. It can reappear many years later to cause a case of shingles.  You cannot catch shingles from another person with shingles. However, a person who has never had chickenpox (or chickenpox vaccine) could get chickenpox from someone with shingles. This is not very common.  Shingles is far more common in people 50 and older than in younger people. It is also more common in people whose immune systems are weakened because of a disease such as cancer or drugs such as steroids or chemotherapy.  At least 1 million people get shingles per year in the [place]. SHINGLES VACCINE  A vaccine for shingles was licensed in 2006. In clinical trials, the vaccine reduced the risk of shingles by 50%. It can also reduce the pain in people who still get shingles after being vaccinated.  A single dose of shingles vaccine is recommended for adults 60 years of age and older. SOME PEOPLE SHOULD NOT GET SHINGLES VACCINE OR SHOULD WAIT A person should not get shingles vaccine if he or she:  Has ever had a life-threatening allergic reaction to gelatin, the  antibiotic neomycin, or any other component of shingles vaccine. Tell your caregiver if you have any severe allergies.  Has a weakened immune system because of current:  AIDS or another disease that affects the immune system.  Treatment with drugs that affect the immune system, such as prolonged use of high-dose steroids.  Cancer treatment, such as radiation or chemotherapy.  Cancer affecting the bone marrow or lymphatic system, such as leukemia or lymphoma.  Is pregnant, or might be pregnant. Women should not become pregnant until at least 4 weeks after getting shingles vaccine. Someone with a minor illness, such as a cold, may be vaccinated. Anyone with a moderate or severe acute illness should usually wait until he or she recovers before getting the vaccine. This includes anyone with a temperature of 101.3 F (38 C) or higher. WHAT ARE THE RISKS FROM SHINGLES VACCINE?  A vaccine, like any medicine, could possibly cause serious problems, such as severe allergic reactions. However, the risk of a vaccine causing serious harm, or death, is extremely small.  No serious problems have been identified with shingles vaccine. Mild Problems  Redness, soreness, swelling, or itching at the site of the injection (about 1 person in 3).  Headache (about 1 person in 70). Like all vaccines, shingles vaccine is being closely monitored for unusual or severe problems. WHAT IF THERE IS A MODERATE OR SEVERE REACTION? What should I look for? Any unusual condition, such as a severe allergic reaction or a high fever. If a severe allergic reaction   occurred, it would be within a few minutes to an hour after the shot. Signs of a serious allergic reaction can include difficulty breathing, weakness, hoarseness or wheezing, a fast heartbeat, hives, dizziness, paleness, or swelling of the throat. What should I do?  Call your caregiver, or get the person to a caregiver right away.  Tell the caregiver what  happened, the date and time it happened, and when the vaccination was given.  Ask the caregiver to report the reaction by filing a Vaccine Adverse Event Reporting System (VAERS) form. Or, you can file this report through the VAERS web site at www.vaers.hhs.gov or by calling 1-800-822-7967. VAERS does not provide medical advice. HOW CAN I LEARN MORE?  Ask your caregiver. He or she can give you the vaccine package insert or suggest other sources of information.  Contact the Centers for Disease Control and Prevention (CDC):  Call 1-800-232-4636 (1-800-CDC-INFO).  Visit the CDC website at www.cdc.gov/vaccines CDC Shingles Vaccine VIS (09/30/08) Document Released: 10/09/2006 Document Revised: 03/05/2012 Document Reviewed: 09/30/2008 [employer] Patient Information 2013 [employer].  

## 2012-11-28 NOTE — Progress Notes (Signed)
[name] Jan 07, 1956 [id number]   History:    56 y.o.  for annual gyn exam with history of osteoporosis that was diagnosed several years ago. Records were reviewed and patient has had been on Boniva for 4 years and due to the fact that based on bone mineral density study she was loosing bone she was started on subcutaneous Prolia on December 2012 q. every 6 month regimen. She is scheduled for her third dose this month. Her last bone density study October 2012 had demonstrated that both right and left femoral neck were in the osteoporotic range with her lowest T score of -3.2 on the right femoral neck. In comparison with the study of 2010 there was greater than 6% statistically significant decrease in bone mineral density on the AP spine and right and total left hip. Patient is not taking calcium but taking vitamin D 1000 units daily. Her primary physician in may of this year had done her lab work and her calcium level was normal at 9.4. Comprehensive metabolic panel, CBC, urinalysis, TSH and lipid profile were all normal. Patient also suffer from vaginal dryness and for sometime had discontinued the Vagifem 10 mcg which she was applying twice weekly and wanted to restart the medication again. Patient has had in the past history of ovarian cyst last scan 1 year ago demonstrated complete resolution of left ovarian cyst. Her mammogram was in March of this year which was normal patient does her monthly self breast examination. She's had past history of colon polyps which were benign her last colonoscopy was in 2009.  Past medical history,surgical history, family history and social history were all reviewed and documented in the EPIC chart.  Gynecologic History No LMP recorded. Patient is postmenopausal. Contraception: post menopausal status Last Pap: 2012. Results were: normal Last mammogram: 2013. Results were: normal  Obstetric History OB History    Grav Para Term Preterm Abortions TAB SAB Ect Mult  Living   2 2 2       2      # Outc Date GA Lbr Len/2nd Wgt Sex Del Anes PTL Lv   1 TRM            2 TRM                ROS: A ROS was performed and pertinent positives and negatives are included in the history.  GENERAL: No fevers or chills. HEENT: No change in vision, no earache, sore throat or sinus congestion. NECK: No pain or stiffness. CARDIOVASCULAR: No chest pain or pressure. No palpitations. PULMONARY: No shortness of breath, cough or wheeze. GASTROINTESTINAL: No abdominal pain, nausea, vomiting or diarrhea, melena or bright red blood per rectum. GENITOURINARY: No urinary frequency, urgency, hesitancy or dysuria. MUSCULOSKELETAL: No joint or muscle pain, no back pain, no recent trauma. DERMATOLOGIC: No rash, no itching, no lesions. ENDOCRINE: No polyuria, polydipsia, no heat or cold intolerance. No recent change in weight. HEMATOLOGICAL: No anemia or easy bruising or bleeding. NEUROLOGIC: No headache, seizures, numbness, tingling or weakness. PSYCHIATRIC: No depression, no loss of interest in normal activity or change in sleep pattern.     Exam: chaperone present  BP 120/70  Ht 5' 4.5" (1.638 m)  Wt 132 lb (59.875 kg)  BMI 22.31 kg/m2  Body mass index is 22.31 kg/(m^2).  General appearance : Well developed well nourished female. No acute distress HEENT: Neck supple, trachea midline, no carotid bruits, no thyroidmegaly Lungs: Clear to auscultation, no rhonchi or wheezes, or rib  retractions  Heart: Regular rate and rhythm, no murmurs or gallops Breast:Examined in sitting and supine position were symmetrical in appearance, no palpable masses or tenderness,  no skin retraction, no nipple inversion, no nipple discharge, no skin discoloration, no axillary or supraclavicular lymphadenopathy Abdomen: no palpable masses or tenderness, no rebound or guarding Extremities: no edema or skin discoloration or tenderness  Pelvic:  Bartholin, Urethra, Skene Glands: Within normal limits              Vagina: No gross lesions or discharge  Cervix: No gross lesions or discharge  Uterus  anteverted, normal size, shape and consistency, non-tender and mobile  Adnexa  Without masses or tenderness  Anus and perineum  normal   Rectovaginal  normal sphincter tone without palpated masses or tenderness             Hemoccult cards provided     Assessment/Plan:  56 y.o. female for annual exam with vaginal atrophy will be restarted again on Vagifem 10 mcg to apply each bedtime for one week and then twice a week thereafter. Once again we discussed women's health initiative study and potential risk of breast cancer and patient understands and accepts. Because of her history of osteoporosis we'll check a vitamin D level and PTH today. She will schedule a bone density study for next month to see how she has responded after one year of Prolia. We once again discussed potential risk of Prolia to include osteonecrosis of the jaw and spontaneous sub-trochanteric fractures. We discussed importance of calcium and vitamin D and and regular exercise. No Pap smear done today new guidelines discussed. Hemoccult cards provided for the patient to submit to the office for testing. She will need a colonoscopy next year. Literature information on the shingles vaccine was provided. We will screen her also for hepatitis C. Due to her history of ovarian cyst she'll have an ultrasound next couple weeks.   Ok Edwards MD, 8:26 PM 11/28/2012

## 2012-11-29 ENCOUNTER — Other Ambulatory Visit: Payer: Self-pay | Admitting: Gynecology

## 2012-11-29 LAB — HEPATITIS C ANTIBODY: HCV Ab: NEGATIVE

## 2012-11-30 ENCOUNTER — Telehealth: Payer: Self-pay | Admitting: Gynecology

## 2012-11-30 LAB — VITAMIN D 25 HYDROXY (VIT D DEFICIENCY, FRACTURES): Vit D, 25-Hydroxy: 48 ng/mL (ref 30–89)

## 2012-11-30 LAB — PARATHYROID HORMONE, INTACT (NO CA): PTH: 66 pg/mL (ref 14.0–72.0)

## 2012-11-30 NOTE — Telephone Encounter (Signed)
Patient asked regarding u/a result. She said she left specimen. I checked with the lab and they said Dr. Glenetta Hew had not ordered u/a and urine was discarded.  Patient said she might speak with him when she comes in Dec 18 regarding having it checked then.

## 2012-12-12 ENCOUNTER — Other Ambulatory Visit: Payer: BC Managed Care – PPO

## 2012-12-12 ENCOUNTER — Ambulatory Visit: Payer: BC Managed Care – PPO | Admitting: Gynecology

## 2012-12-14 ENCOUNTER — Encounter: Payer: Self-pay | Admitting: Anesthesiology

## 2012-12-14 ENCOUNTER — Ambulatory Visit (INDEPENDENT_AMBULATORY_CARE_PROVIDER_SITE_OTHER): Payer: BC Managed Care – PPO | Admitting: Anesthesiology

## 2012-12-14 DIAGNOSIS — M810 Age-related osteoporosis without current pathological fracture: Secondary | ICD-10-CM

## 2012-12-14 MED ORDER — DENOSUMAB 60 MG/ML ~~LOC~~ SOLN
60.0000 mg | Freq: Once | SUBCUTANEOUS | Status: AC
  Administered 2012-12-14: 60 mg via SUBCUTANEOUS

## 2013-01-08 ENCOUNTER — Ambulatory Visit (INDEPENDENT_AMBULATORY_CARE_PROVIDER_SITE_OTHER): Payer: BC Managed Care – PPO

## 2013-01-08 DIAGNOSIS — M810 Age-related osteoporosis without current pathological fracture: Secondary | ICD-10-CM

## 2013-01-21 ENCOUNTER — Ambulatory Visit (INDEPENDENT_AMBULATORY_CARE_PROVIDER_SITE_OTHER): Payer: BC Managed Care – PPO

## 2013-01-21 ENCOUNTER — Ambulatory Visit (INDEPENDENT_AMBULATORY_CARE_PROVIDER_SITE_OTHER): Payer: BC Managed Care – PPO | Admitting: Gynecology

## 2013-01-21 DIAGNOSIS — Z8742 Personal history of other diseases of the female genital tract: Secondary | ICD-10-CM

## 2013-01-21 DIAGNOSIS — M810 Age-related osteoporosis without current pathological fracture: Secondary | ICD-10-CM

## 2013-01-21 DIAGNOSIS — N952 Postmenopausal atrophic vaginitis: Secondary | ICD-10-CM

## 2013-01-21 DIAGNOSIS — N83339 Acquired atrophy of ovary and fallopian tube, unspecified side: Secondary | ICD-10-CM

## 2013-01-21 NOTE — Progress Notes (Signed)
Patient presented to the office today to discuss her recent bone density study as a followup after a year of being on Prolia for her osteoporosis. Also she's had past history of ovarian cysts and was here for an ultrasound and to discuss these  result as well as her recent labs at time of her annual exam in 11/28/2012 see previous note.  Ultrasound demonstrated today a uterus that measures 7.1 x 3.8 x 3.0 cm with an endometrial stripe of 5.5 mm. Right ovary and left ovary were normal. No apparent masses were seen.  Bone density done agrees [facility]necology associate 01/08/2013 demonstrating no statistical significant increase in bone mineral density in the AP spine, total left and right hip. Her lowest T score was in the left femoral neck with a score -2.9. Patient a few days ago received her third dose of Prolia 60 mg which she received subcutaneous every 6 months. She will have a follow bone density study in 2 years. We discussed importance of calcium and vitamin D and regular exercise. Her recent PTH and vitamin D, BUN and Creatinine were normal.  Patient's otherwise scheduled to return back next year for her annual exam or when necessary. Of note she is recently restarted her Vagifem 10 mcg twice a week for vaginal atrophy.

## 2013-01-30 ENCOUNTER — Other Ambulatory Visit: Payer: Self-pay | Admitting: Gynecology

## 2013-01-30 ENCOUNTER — Other Ambulatory Visit: Payer: Self-pay | Admitting: Anesthesiology

## 2013-01-30 DIAGNOSIS — Z1211 Encounter for screening for malignant neoplasm of colon: Secondary | ICD-10-CM

## 2013-03-05 ENCOUNTER — Other Ambulatory Visit: Payer: Self-pay

## 2013-03-05 DIAGNOSIS — Z1231 Encounter for screening mammogram for malignant neoplasm of breast: Secondary | ICD-10-CM

## 2013-04-02 ENCOUNTER — Ambulatory Visit
Admission: RE | Admit: 2013-04-02 | Discharge: 2013-04-02 | Disposition: A | Discharge status: Home / Self Care | Payer: BC Managed Care – PPO | Source: Ambulatory Visit

## 2013-04-02 DIAGNOSIS — Z1231 Encounter for screening mammogram for malignant neoplasm of breast: Secondary | ICD-10-CM

## 2013-05-15 ENCOUNTER — Encounter: Payer: Self-pay | Admitting: Gynecology

## 2013-05-21 ENCOUNTER — Encounter: Payer: Self-pay | Admitting: Gynecology

## 2013-07-04 ENCOUNTER — Telehealth: Payer: Self-pay | Admitting: *Deleted

## 2013-07-04 NOTE — Telephone Encounter (Signed)
pts insurance checked for Prolia. $70 copay, PA is on file til 11/29/13. LM for pt that she can call and schedule time for injection KW

## 2013-07-11 ENCOUNTER — Ambulatory Visit: Payer: Self-pay

## 2013-07-15 ENCOUNTER — Ambulatory Visit (INDEPENDENT_AMBULATORY_CARE_PROVIDER_SITE_OTHER): Payer: BC Managed Care – PPO | Admitting: Anesthesiology

## 2013-07-15 DIAGNOSIS — M810 Age-related osteoporosis without current pathological fracture: Secondary | ICD-10-CM

## 2013-07-15 MED ORDER — DENOSUMAB 60 MG/ML ~~LOC~~ SOLN
60.0000 mg | Freq: Once | SUBCUTANEOUS | Status: AC
  Administered 2013-07-15: 60 mg via SUBCUTANEOUS

## 2013-12-12 ENCOUNTER — Other Ambulatory Visit: Payer: Self-pay | Admitting: Gynecology

## 2013-12-12 ENCOUNTER — Ambulatory Visit (INDEPENDENT_AMBULATORY_CARE_PROVIDER_SITE_OTHER): Payer: BC Managed Care – PPO | Admitting: Gynecology

## 2013-12-12 ENCOUNTER — Encounter: Payer: Self-pay | Admitting: Gynecology

## 2013-12-12 VITALS — BP 118/76 | Ht 65.0 in | Wt 132.6 lb

## 2013-12-12 DIAGNOSIS — N952 Postmenopausal atrophic vaginitis: Secondary | ICD-10-CM

## 2013-12-12 DIAGNOSIS — M810 Age-related osteoporosis without current pathological fracture: Secondary | ICD-10-CM

## 2013-12-12 DIAGNOSIS — G4700 Insomnia, unspecified: Secondary | ICD-10-CM

## 2013-12-12 DIAGNOSIS — F419 Anxiety disorder, unspecified: Secondary | ICD-10-CM

## 2013-12-12 DIAGNOSIS — Z01419 Encounter for gynecological examination (general) (routine) without abnormal findings: Secondary | ICD-10-CM

## 2013-12-12 DIAGNOSIS — Z8742 Personal history of other diseases of the female genital tract: Secondary | ICD-10-CM

## 2013-12-12 DIAGNOSIS — F411 Generalized anxiety disorder: Secondary | ICD-10-CM

## 2013-12-12 LAB — CREATININE, SERUM: Creat: 0.65 mg/dL (ref 0.50–1.10)

## 2013-12-12 LAB — BUN: BUN: 11 mg/dL (ref 6–23)

## 2013-12-12 MED ORDER — ALPRAZOLAM 0.25 MG PO TABS: 0.2500 mg | ORAL_TABLET | ORAL | Status: DC | PRN

## 2013-12-12 MED ORDER — ESTRADIOL 10 MCG VA TABS: ORAL_TABLET | VAGINAL | Status: DC

## 2013-12-12 NOTE — Patient Instructions (Addendum)
Influenza Vaccine (Flu Vaccine, Inactivated) 2013 2014 What You Need to Know WHY GET VACCINATED?  Influenza ("flu") is a contagious disease that spreads around the [place] every winter, usually between October and May.  Flu is caused by the influenza virus, and can be spread by coughing, sneezing, and close contact.  Anyone can get flu, but the risk of getting flu is highest among children. Symptoms come on suddenly and may last several days. They can include:  Fever or chills.  Sore throat.  Muscle aches.  Fatigue.  Cough.  Headache.  Runny or stuffy nose. Flu can make some people much sicker than others. These people include young children, people 65 and older, pregnant women, and people with certain health conditions such as heart, lung or kidney disease, or a weakened immune system. Flu vaccine is especially important for these people, and anyone in close contact with them. Flu can also lead to pneumonia, and make existing medical conditions worse. It can cause diarrhea and seizures in children. Each year thousands of people in the [place] die from flu, and many more are hospitalized. Flu vaccine is the best protection we have from flu and its complications. Flu vaccine also helps prevent spreading flu from person to person. INACTIVATED FLU VACCINE There are 2 types of influenza vaccine:  You are getting an inactivated flu vaccine, which does not contain any live influenza virus. It is given by injection with a needle, and often called the "flu shot."  A different live, attenuated (weakened) influenza vaccine is sprayed into the nostrils. This vaccine is described in a separate Vaccine Information Statement. Flu vaccine is recommended every year. Children 6 months through 8 years of age should get 2 doses the first year they get vaccinated. Flu viruses are always changing. Each year's flu vaccine is made to protect from viruses that are most likely to cause disease  that year. While flu vaccine cannot prevent all cases of flu, it is our best defense against the disease. Inactivated flu vaccine protects against 3 or 4 different influenza viruses. It takes about 2 weeks for protection to develop after the vaccination, and protection lasts several months to a year. Some illnesses that are not caused by influenza virus are often mistaken for flu. Flu vaccine will not prevent these illnesses. It can only prevent influenza. A "high-dose" flu vaccine is available for people 65 years of age and older. The person giving you the vaccine can tell you more about it. Some inactivated flu vaccine contains a very small amount of a mercury-based preservative called thimerosal. Studies have shown that thimerosal in vaccines is not harmful, but flu vaccines that do not contain a preservative are available. SOME PEOPLE SHOULD NOT GET THIS VACCINE Tell the person who gives you the vaccine:  If you have any severe (life-threatening) allergies. If you ever had a life-threatening allergic reaction after a dose of flu vaccine, or have a severe allergy to any part of this vaccine, you may be advised not to get a dose. Most, but not all, types of flu vaccine contain a small amount of egg.  If you ever had Guillain Barr Syndrome (a severe paralyzing illness, also called GBS). Some people with a history of GBS should not get this vaccine. This should be discussed with your doctor.  If you are not feeling well. They might suggest waiting until you feel better. But you should come back. RISKS OF A VACCINE REACTION With a vaccine, like any medicine, there   is a chance of side effects. These are usually mild and go away on their own. Serious side effects are also possible, but are very rare. Inactivated flu vaccine does not contain live flu virus, sogetting flu from this vaccine is not possible. Brief fainting spells and related symptoms (such as jerking movements) can happen after any medical  procedure, including vaccination. Sitting or lying down for about 15 minutes after a vaccination can help prevent fainting and injuries caused by falls. Tell your doctor if you feel dizzy or lightheaded, or have vision changes or ringing in the ears. Mild problems following inactivated flu vaccine:  Soreness, redness, or swelling where the shot was given.  Hoarseness; sore, red or itchy eyes; or cough.  Fever.  Aches.  Headache.  Itching.  Fatigue. If these problems occur, they usually begin soon after the shot and last 1 or 2 days. Moderate problems following inactivated flu vaccine:  Young children who get inactivated flu vaccine and pneumococcal vaccine (PCV13) at the same time may be at increased risk for seizures caused by fever. Ask your doctor for more information. Tell your doctor if a child who is getting flu vaccine has ever had a seizure. Severe problems following inactivated flu vaccine:  A severe allergic reaction could occur after any vaccine (estimated less than 1 in a million doses).  There is a small possibility that inactivated flu vaccine could be associated with Guillan Barr Syndrome (GBS), no more than 1 or 2 cases per million people vaccinated. This is much lower than the risk of severe complications from flu, which can be prevented by flu vaccine. The safety of vaccines is always being monitored. For more information, visit: http://floyd.org/ WHAT IF THERE IS A SERIOUS REACTION? What should I look for?  Look for anything that concerns you, such as signs of a severe allergic reaction, very high fever, or behavior changes. Signs of a severe allergic reaction can include hives, swelling of the face and throat, difficulty breathing, a fast heartbeat, dizziness, and weakness. These would start a few minutes to a few hours after the vaccination. What should I do?  If you think it is a severe allergic reaction or other emergency that cannot wait, call 9 1 1   or get the person to the nearest hospital. Otherwise, call your doctor.  Afterward, the reaction should be reported to the Vaccine Adverse Event Reporting System (VAERS). Your doctor might file this report, or you can do it yourself through the VAERS website at www.vaers.LAgents.no, or by calling 1-419-509-0571. VAERS is only for reporting reactions. They do not give medical advice. THE NATIONAL VACCINE INJURY COMPENSATION PROGRAM The National Vaccine Injury Compensation Program (VICP) is a federal program that was created to compensate people who may have been injured by certain vaccines. Persons who believe they may have been injured by a vaccine can learn about the program and about filing a claim by calling 1-872-029-3282 or visiting the VICP website at SpiritualWord.at HOW CAN I LEARN MORE?  Ask your doctor.  Call your local or state health department.  Contact the Centers for Disease Control and Prevention (CDC):  Call 442-463-2794 (1-800-CDC-INFO) or  Visit CDC's website at BiotechRoom.com.cy CDC Inactivated Influenza Vaccine Interim VIS (07/20/12) Document Released: 10/06/2006 Document Revised: 09/05/2012 Document Reviewed: 08/14/2012 [facility] Patient Information 2014 [place], [place]. Shingles Vaccine What You Need to Know WHAT IS SHINGLES?  Shingles is a painful skin rash, often with blisters. It is also called Herpes Zoster or just Zoster.  A shingles  rash usually appears on one side of the face or body and lasts from 2 to 4 weeks. Its main symptom is pain, which can be quite severe. Other symptoms of shingles can include fever, headache, chills, and upset stomach. Very rarely, a shingles infection can lead to pneumonia, hearing problems, blindness, brain inflammation (encephalitis), or death.  For about 1 person in 5, severe pain can continue even after the rash clears up. This is called post-herpetic neuralgia.  Shingles is caused by the Varicella Zoster virus.  This is the same virus that causes chickenpox. Only someone who has had a case of chickenpox or rarely, has gotten chickenpox vaccine, can get shingles. The virus stays in your body. It can reappear many years later to cause a case of shingles.  You cannot catch shingles from another person with shingles. However, a person who has never had chickenpox (or chickenpox vaccine) could get chickenpox from someone with shingles. This is not very common.  Shingles is far more common in people 38 and older than in younger people. It is also more common in people whose immune systems are weakened because of a disease such as cancer or drugs such as steroids or chemotherapy.  At least 1 million people get shingles per year in the [place]. SHINGLES VACCINE  A vaccine for shingles was licensed in 2006. In clinical trials, the vaccine reduced the risk of shingles by 50%. It can also reduce the pain in people who still get shingles after being vaccinated.  A single dose of shingles vaccine is recommended for adults 53 years of age and older. SOME PEOPLE SHOULD NOT GET SHINGLES VACCINE OR SHOULD WAIT A person should not get shingles vaccine if he or she:  Has ever had a life-threatening allergic reaction to gelatin, the antibiotic neomycin, or any other component of shingles vaccine. Tell your caregiver if you have any severe allergies.  Has a weakened immune system because of current:  AIDS or another disease that affects the immune system.  Treatment with drugs that affect the immune system, such as prolonged use of high-dose steroids.  Cancer treatment, such as radiation or chemotherapy.  Cancer affecting the bone marrow or lymphatic system, such as leukemia or lymphoma.  Is pregnant, or might be pregnant. Women should not become pregnant until at least 4 weeks after getting shingles vaccine. Someone with a minor illness, such as a cold, may be vaccinated. Anyone with a moderate or severe acute  illness should usually wait until he or she recovers before getting the vaccine. This includes anyone with a temperature of 101.3 F (38 C) or higher. WHAT ARE THE RISKS FROM SHINGLES VACCINE?  A vaccine, like any medicine, could possibly cause serious problems, such as severe allergic reactions. However, the risk of a vaccine causing serious harm, or death, is extremely small.  No serious problems have been identified with shingles vaccine. Mild Problems  Redness, soreness, swelling, or itching at the site of the injection (about 1 person in 3).  Headache (about 1 person in 70). Like all vaccines, shingles vaccine is being closely monitored for unusual or severe problems. WHAT IF THERE IS A MODERATE OR SEVERE REACTION? What should I look for? Any unusual condition, such as a severe allergic reaction or a high fever. If a severe allergic reaction occurred, it would be within a few minutes to an hour after the shot. Signs of a serious allergic reaction can include difficulty breathing, weakness, hoarseness or wheezing, a fast heartbeat,  hives, dizziness, paleness, or swelling of the throat. What should I do?  Call your caregiver, or get the person to a caregiver right away.  Tell the caregiver what happened, the date and time it happened, and when the vaccination was given.  Ask the caregiver to report the reaction by filing a Vaccine Adverse Event Reporting System (VAERS) form. Or, you can file this report through the VAERS web site at www.vaers.LAgents.no or by calling 1-717-279-9198. VAERS does not provide medical advice. HOW CAN I LEARN MORE?  Ask your caregiver. He or she can give you the vaccine package insert or suggest other sources of information.  Contact the Centers for Disease Control and Prevention (CDC):  Call (972)159-9244 (1-800-CDC-INFO).  Visit the CDC website at PicCapture.uy CDC Shingles Vaccine VIS (09/30/08) Document Released: 10/09/2006 Document Revised:  03/05/2012 Document Reviewed: 04/03/2013 [facility] Patient Information 2014 [place], [place].

## 2013-12-12 NOTE — Progress Notes (Addendum)
[name] 1956-07-04 [id number]   History:    57 y.o.  for annual gyn exam with past history of ovarian cyst. 11 months ago she had a normal ultrasound in his office. She brought her lab work from her primary physician office Dr. Ouida Sills. She had a normal urinalysis, CBC, comprehensive metabolic panel and a lipid profile that were drawn on 08/01/2013. Patient uncertain on the flu vaccine. Patient has not received the shingles vaccine yet. Patient suffers from insomnia for which she is going to begin taking melatonin. She has taken Xanax 0.25 mg each bedtime when necessary in the past and at times and for anxiety.   Patient was seen in the office in January this year to discuss her most recent bone density study. Patient with history of osteoporosis had been started on Prolia 60 mg subcutaneous Q6 months in December 2012. Patient due for her next injection this coming January.  Bone density study 01/08/2013 demonstrated  no statistical significant increase in bone mineral density in the AP spine, total left and right hip. Her lowest T score was in the left femoral neck with a score -2.9.   Past medical history,surgical history, family history and social history were all reviewed and documented in the EPIC chart.  Gynecologic History No LMP recorded. Patient is postmenopausal. Contraception: post menopausal status Last Pap: 2012. Results were: normal Last mammogram: 2014. Results were: normal  Obstetric History OB History  Gravida Para Term Preterm AB SAB TAB Ectopic Multiple Living  2 2 2       2     # Outcome Date GA Lbr Len/2nd Weight Sex Delivery Anes PTL Lv  2 TRM           1 TRM                ROS: A ROS was performed and pertinent positives and negatives are included in the history.  GENERAL: No fevers or chills. HEENT: No change in vision, no earache, sore throat or sinus congestion. NECK: No pain or stiffness. CARDIOVASCULAR: No chest pain or pressure. No palpitations.  PULMONARY: No shortness of breath, cough or wheeze. GASTROINTESTINAL: No abdominal pain, nausea, vomiting or diarrhea, melena or bright red blood per rectum. GENITOURINARY: No urinary frequency, urgency, hesitancy or dysuria. MUSCULOSKELETAL: No joint or muscle pain, no back pain, no recent trauma. DERMATOLOGIC: No rash, no itching, no lesions. ENDOCRINE: No polyuria, polydipsia, no heat or cold intolerance. No recent change in weight. HEMATOLOGICAL: No anemia or easy bruising or bleeding. NEUROLOGIC: No headache, seizures, numbness, tingling or weakness. PSYCHIATRIC: No depression, no loss of interest in normal activity or change in sleep pattern.     Exam: chaperone present  BP 118/76  Ht 5\' 5"  (1.651 m)  Wt 60.147 kg (132 lb 9.6 oz)  BMI 22.07 kg/m2  Body mass index is 22.07 kg/(m^2).  General appearance : Well developed well nourished female. No acute distress HEENT: Neck supple, trachea midline, no carotid bruits, no thyroidmegaly Lungs: Clear to auscultation, no rhonchi or wheezes, or rib retractions  Heart: Regular rate and rhythm, no murmurs or gallops Breast:Examined in sitting and supine position were symmetrical in appearance, no palpable masses or tenderness,  no skin retraction, no nipple inversion, no nipple discharge, no skin discoloration, no axillary or supraclavicular lymphadenopathy Abdomen: no palpable masses or tenderness, no rebound or guarding Extremities: no edema or skin discoloration or tenderness  Pelvic:  Bartholin, Urethra, Skene Glands: Within normal limits  Vagina: No gross lesions or discharge  Cervix: No gross lesions or discharge  Uterus  anteverted, normal size, shape and consistency, non-tender and mobile  Adnexa  Without masses or tenderness  Anus and perineum  normal   Rectovaginal  normal sphincter tone without palpated masses or tenderness             Hemoccult PCP provides. Patient with colonoscopy May of this year 2 benign polyps  removed     Assessment/Plan:  57 y.o. female for annual exam for the shear had 2 benign polyps removed. Patient with history of osteoporosis had been started on Prolia 60 mg subcutaneous Q6 months due for next injection in January. The following labs were ordered today: Calcium, vitamin D, BUN, creatinine as well as TSH. Pap smear was not done today in accordance to the new guidelines. We discussed importance of monthly self breast examination. We discussed importance of calcium and vitamin D and regular exercise. An ultrasound will be scheduled for January due to her past history of ovarian cysts and some what  limited pelvic exam today.  Note: This dictation was prepared with  Dragon/digital dictation along withSmart phrase technology. Any transcriptional errors that result from this process are unintentional.   Ok Edwards MD, 12:16 PM 12/12/2013

## 2013-12-14 LAB — VITAMIN D 25 HYDROXY (VIT D DEFICIENCY, FRACTURES): Vit D, 25-Hydroxy: 50 ng/mL (ref 30–89)

## 2013-12-16 ENCOUNTER — Other Ambulatory Visit: Payer: Self-pay | Admitting: Obstetrics and Gynecology

## 2013-12-16 ENCOUNTER — Other Ambulatory Visit: Payer: Self-pay | Admitting: Gynecology

## 2013-12-16 DIAGNOSIS — E349 Endocrine disorder, unspecified: Secondary | ICD-10-CM

## 2013-12-17 ENCOUNTER — Other Ambulatory Visit: Payer: Self-pay | Admitting: Gynecology

## 2013-12-30 ENCOUNTER — Other Ambulatory Visit: Payer: BC Managed Care – PPO

## 2014-01-01 ENCOUNTER — Other Ambulatory Visit: Payer: BC Managed Care – PPO

## 2014-01-01 DIAGNOSIS — E349 Endocrine disorder, unspecified: Secondary | ICD-10-CM

## 2014-01-01 DIAGNOSIS — R7989 Other specified abnormal findings of blood chemistry: Secondary | ICD-10-CM

## 2014-01-02 ENCOUNTER — Telehealth: Payer: Self-pay

## 2014-01-02 ENCOUNTER — Other Ambulatory Visit: Payer: Self-pay | Admitting: Gynecology

## 2014-01-02 DIAGNOSIS — E349 Endocrine disorder, unspecified: Secondary | ICD-10-CM

## 2014-01-02 LAB — PARATHYROID HORMONE, INTACT (NO CA): PTH: 75.2 pg/mL — ABNORMAL HIGH (ref 14.0–72.0)

## 2014-01-02 NOTE — Telephone Encounter (Signed)
Patient was informed earlier today that the PTH dropped since last time and appears to be returning to normal.  She has questions:  1.  Is there anything she can do between now and 6 mos from now (when you want her to recheck it) to help lower it?  2.  What is causing it to be elevated. She asked about diet? Medication? Why it was elevated anyway?

## 2014-01-02 NOTE — Telephone Encounter (Signed)
There is nothing that she can do to bring the value down. When PTH level is elevated individuals may have hypercalcemia. Her calcium and vitamin D level were normal. Close followup repeating a PTH in 6 months is warranted. If it continues to rise then we would need to do a scan to rule out a hyperparathyroid nodule or mass which most of the time they cannot be palpated on exam. It is minimally elevated not highly suspicious but I do want to followup with a PTH in 6 months as well as a neck exam by me after she gives her blood drawn.

## 2014-01-03 ENCOUNTER — Other Ambulatory Visit: Payer: Self-pay | Admitting: Gynecology

## 2014-01-03 DIAGNOSIS — E349 Endocrine disorder, unspecified: Secondary | ICD-10-CM

## 2014-01-03 NOTE — Telephone Encounter (Signed)
Patient informed. Order and recall put in system.

## 2014-01-22 ENCOUNTER — Encounter: Payer: Self-pay | Admitting: *Deleted

## 2014-01-22 ENCOUNTER — Ambulatory Visit (INDEPENDENT_AMBULATORY_CARE_PROVIDER_SITE_OTHER): Payer: BC Managed Care – PPO

## 2014-01-22 ENCOUNTER — Encounter: Payer: Self-pay | Admitting: Gynecology

## 2014-01-22 ENCOUNTER — Ambulatory Visit (INDEPENDENT_AMBULATORY_CARE_PROVIDER_SITE_OTHER): Payer: BC Managed Care – PPO | Admitting: Gynecology

## 2014-01-22 ENCOUNTER — Other Ambulatory Visit: Payer: Self-pay | Admitting: Gynecology

## 2014-01-22 VITALS — BP 134/86

## 2014-01-22 DIAGNOSIS — M810 Age-related osteoporosis without current pathological fracture: Secondary | ICD-10-CM

## 2014-01-22 DIAGNOSIS — N83339 Acquired atrophy of ovary and fallopian tube, unspecified side: Secondary | ICD-10-CM

## 2014-01-22 DIAGNOSIS — R9389 Abnormal findings on diagnostic imaging of other specified body structures: Secondary | ICD-10-CM

## 2014-01-22 DIAGNOSIS — Z8742 Personal history of other diseases of the female genital tract: Secondary | ICD-10-CM

## 2014-01-22 DIAGNOSIS — N83209 Unspecified ovarian cyst, unspecified side: Secondary | ICD-10-CM

## 2014-01-22 DIAGNOSIS — N942 Vaginismus: Secondary | ICD-10-CM

## 2014-01-22 MED ORDER — DENOSUMAB 60 MG/ML ~~LOC~~ SOLN
60.0000 mg | Freq: Once | SUBCUTANEOUS | Status: AC
  Administered 2014-01-22: 60 mg via SUBCUTANEOUS

## 2014-01-22 NOTE — Progress Notes (Signed)
Pt's insurance covers Prolia $30 copay and prior authorization approved for a year til 01/15/15. Copies are scanned in EPIC

## 2014-01-22 NOTE — Addendum Note (Signed)
Addended by: Alen Blew on: 01/22/2014 04:55 PM   Modules accepted: Orders

## 2014-01-22 NOTE — Progress Notes (Signed)
   In the patient presented through the office today to discuss her ultrasound. Issue with past history of ovarian cyst and there was some vaginismus during pelvic exam. She is postmenopausal. Patient is here today for her next Prolia injection as a result of her osteoporosis. She was started on it in December 2012.  Reveals a recent labs indicated that her BUN, creatinine, calcium and vitamin D were normal in December 2014. Her PTH was slightly elevated at 83.1 and on repeat on 01/01/2014 it had decreased to 75. We will recheck and six-month period  Ultrasound today: Uterus measures 6.3 x 3.9 x 2.8 cm with endometrial stripe of 3.9 mm. Right and left ovary were normal otherwise.

## 2014-03-25 ENCOUNTER — Telehealth: Payer: Self-pay | Admitting: *Deleted

## 2014-03-25 NOTE — Telephone Encounter (Signed)
1.Pt had ultrasound 01/22/14 she would like to know compared to last ultrasound if there is any change? 2. Does she just follow up for yearly ultrasound? Please advise

## 2014-03-25 NOTE — Telephone Encounter (Signed)
Please inform patient that her ultrasound was normal. There were no ovarian cyst. Since she has had history of ovarian cysts in the past and sometimes if it may be difficult for her pelvic exam to feel the actual ovaries we would like to perhaps order an ultrasound once a year at time of her annual exam

## 2014-03-26 NOTE — Telephone Encounter (Signed)
Please inform patient of last year her endometrial stripe was 5.5 which was right at the cut off incision was not have any bleeding it was not warranted to do an endometrial biopsy. This last ultrasound was normal with an endometrial stripe of 3.3 mm. Patient is to take unopposed estrogen can get increase lining of her uterus (hyperplasia) or patients who are obese  staff high-risk for endometrial hyperplasia and cancer. Please reassure that she is fine and no further intervention is needed. Also the new guidelines by the [place] recommends Pap smears every 3 years until the age of 107 and then as the patient has not had any history of high-risk dysplasia in the previous 20 years she will no longer needs Pap smears.

## 2014-03-26 NOTE — Telephone Encounter (Signed)
Spoke with patient regarding the below note and has a couple more questions.  1.On 01/22/14 she had endometrial stripe of 3.33mm and ultrasound on 01/21/13 endometrial stripe of 5.5. Told by ultrasound tech that if stripe greater than 8mm a Bx maybe done? Pt said Bx was never done. She asked should this have been done?  2.what causes the endometrial stripe to go from 5.79mm to 3.55mm?  3. Pt concerned about not having a pap smear in 2 year due to new guidelines, which I explained to patient. She would like to know in your opinion  if you think she should get one?

## 2014-03-27 NOTE — Telephone Encounter (Signed)
Pt informed with all the below note. 

## 2014-06-02 ENCOUNTER — Other Ambulatory Visit: Payer: Self-pay

## 2014-06-02 DIAGNOSIS — Z1231 Encounter for screening mammogram for malignant neoplasm of breast: Secondary | ICD-10-CM

## 2014-06-09 ENCOUNTER — Encounter (INDEPENDENT_AMBULATORY_CARE_PROVIDER_SITE_OTHER): Payer: Self-pay

## 2014-06-09 ENCOUNTER — Ambulatory Visit
Admission: RE | Admit: 2014-06-09 | Discharge: 2014-06-09 | Disposition: A | Discharge status: Home / Self Care | Payer: BC Managed Care – PPO | Source: Ambulatory Visit

## 2014-06-09 DIAGNOSIS — Z1231 Encounter for screening mammogram for malignant neoplasm of breast: Secondary | ICD-10-CM

## 2014-07-22 ENCOUNTER — Telehealth: Payer: Self-pay | Admitting: *Deleted

## 2014-07-22 NOTE — Telephone Encounter (Signed)
Prolia benefits requested; Pa request sent. KW CMA

## 2014-07-24 NOTE — Telephone Encounter (Signed)
LM for pt to call back KW CMA

## 2014-07-28 NOTE — Telephone Encounter (Signed)
Talked to patient and she wants to wait until Sept. Has a lot going on in August. I will call her then [facility] CMA

## 2014-09-10 NOTE — Telephone Encounter (Signed)
$  30 copay. PA good til 01/15/15. Pt informed. KW CMA

## 2014-09-24 ENCOUNTER — Other Ambulatory Visit: Payer: BC Managed Care – PPO

## 2014-09-24 ENCOUNTER — Ambulatory Visit (INDEPENDENT_AMBULATORY_CARE_PROVIDER_SITE_OTHER): Payer: BC Managed Care – PPO | Admitting: Anesthesiology

## 2014-09-24 DIAGNOSIS — M810 Age-related osteoporosis without current pathological fracture: Secondary | ICD-10-CM

## 2014-09-24 DIAGNOSIS — E349 Endocrine disorder, unspecified: Secondary | ICD-10-CM

## 2014-09-24 MED ORDER — DENOSUMAB 60 MG/ML ~~LOC~~ SOLN
60.0000 mg | Freq: Once | SUBCUTANEOUS | Status: AC
  Administered 2014-09-24: 60 mg via SUBCUTANEOUS

## 2014-09-24 MED ORDER — DENOSUMAB 60 MG/ML ~~LOC~~ SOLN: 60.0000 mg | Freq: Once | SUBCUTANEOUS | Status: DC

## 2014-09-25 ENCOUNTER — Encounter: Payer: Self-pay | Admitting: Gynecology

## 2014-09-25 LAB — PARATHYROID HORMONE, INTACT (NO CA): PTH: 45 pg/mL (ref 14–64)

## 2014-10-27 ENCOUNTER — Encounter: Payer: Self-pay | Admitting: Gynecology

## 2014-12-16 ENCOUNTER — Other Ambulatory Visit (HOSPITAL_COMMUNITY)
Admission: RE | Admit: 2014-12-16 | Discharge: 2014-12-16 | Disposition: A | Discharge status: Home / Self Care | Payer: BC Managed Care – PPO | Source: Ambulatory Visit | Attending: Gynecology | Admitting: Gynecology

## 2014-12-16 ENCOUNTER — Encounter: Payer: Self-pay | Admitting: Gynecology

## 2014-12-16 ENCOUNTER — Ambulatory Visit (INDEPENDENT_AMBULATORY_CARE_PROVIDER_SITE_OTHER): Payer: BC Managed Care – PPO | Admitting: Gynecology

## 2014-12-16 VITALS — BP 134/88 | Ht 65.0 in | Wt 136.8 lb

## 2014-12-16 DIAGNOSIS — Z01419 Encounter for gynecological examination (general) (routine) without abnormal findings: Secondary | ICD-10-CM | POA: Insufficient documentation

## 2014-12-16 DIAGNOSIS — Z86010 Personal history of colonic polyps: Secondary | ICD-10-CM

## 2014-12-16 DIAGNOSIS — N952 Postmenopausal atrophic vaginitis: Secondary | ICD-10-CM

## 2014-12-16 DIAGNOSIS — Z79890 Hormone replacement therapy (postmenopausal): Secondary | ICD-10-CM

## 2014-12-16 DIAGNOSIS — I10 Essential (primary) hypertension: Secondary | ICD-10-CM

## 2014-12-16 DIAGNOSIS — Z1151 Encounter for screening for human papillomavirus (HPV): Secondary | ICD-10-CM | POA: Insufficient documentation

## 2014-12-16 DIAGNOSIS — M810 Age-related osteoporosis without current pathological fracture: Secondary | ICD-10-CM

## 2014-12-16 NOTE — Progress Notes (Signed)
[name] 1956/12/21 [id number]   History:    58 y.o.  for annual gyn exam who has history of osteoporosis. Review of her record indicated that she was started on Prolia in 2012 the following is a summary of her injections:  December 2012 June 2013 December 2013 July 2014 January 2015 did not receive injection September 2015  She brought her lab work from her primary physician office Dr. Willey Blade.. She had a normal CBC, comprehensive metabolic panel and a lipid profile, and TSH. He is treating her for hypertension and today her blood pressures were 134/88 and 130/90 she did not take her medication yesterday. Patient has been followed by Dr.Hung  gastroenterologist who did her colonoscopy in 2014 and a polyp was removed, she has had polyps removed in the past and is currently on a 5 year recall. He has also been following her for gastroesophageal reflux disease. Patient has not received the shingles vaccine as of yet but she has received her Tdap and flu vaccine. Patient's last bone density study was in 2014 with lowest T score -2.9 at the left femoral neck stable when compared to previous study. Patient is taking 2000 units of vitamin D daily. Patient was prescribed last year Vagifem 10 g to apply intravaginally for vaginal atrophy and takes a when necessary.  Past medical history,surgical history, family history and social history were all reviewed and documented in the EPIC chart.  Gynecologic History No LMP recorded. Patient is postmenopausal. Contraception: post menopausal status Last Pap:2012esults were: normal Last mammogram:2015esults were: normal  Obstetric History OB History  Gravida Para Term Preterm AB SAB TAB Ectopic Multiple Living  2 2 2       2     # Outcome Date GA Lbr Len/2nd Weight Sex Delivery Anes PTL Lv  2 Term           1 Term                ROS: A ROS was performed and pertinent positives and negatives are included in the history.  GENERAL: No fevers or  chills. HEENT: No change in vision, no earache, sore throat or sinus congestion. NECK: No pain or stiffness. CARDIOVASCULAR: No chest pain or pressure. No palpitations. PULMONARY: No shortness of breath, cough or wheeze. GASTROINTESTINAL: No abdominal pain, nausea, vomiting or diarrhea, melena or bright red blood per rectum. GENITOURINARY: No urinary frequency, urgency, hesitancy or dysuria. MUSCULOSKELETAL: No joint or muscle pain, no back pain, no recent trauma. DERMATOLOGIC: No rash, no itching, no lesions. ENDOCRINE: No polyuria, polydipsia, no heat or cold intolerance. No recent change in weight. HEMATOLOGICAL: No anemia or easy bruising or bleeding. NEUROLOGIC: No headache, seizures, numbness, tingling or weakness. PSYCHIATRIC: No depression, no loss of interest in normal activity or change in sleep pattern.     Exam: chaperone present  BP 134/88 mmHg  Ht 5\' 5"  (1.651 m)  Wt 136 lb 12.8 oz (62.052 kg)  BMI 22.76 kg/m2  Body mass index is 22.76 kg/(m^2).  General appearance : Well developed well nourished female. No acute distress HEENT: Neck supple, trachea midline, no carotid bruits, no thyroidmegaly Lungs: Clear to auscultation, no rhonchi or wheezes, or rib retractions  Heart: Regular rate and rhythm, no murmurs or gallops Breast:Examined in sitting and supine position were symmetrical in appearance, no palpable masses or tenderness,  no skin retraction, no nipple inversion, no nipple discharge, no skin discoloration, no axillary or supraclavicular lymphadenopathy Abdomen: no palpable masses or tenderness,  no rebound or guarding Extremities: no edema or skin discoloration or tenderness  Pelvic:  Bartholin, Urethra, Skene Glands: Within normal limits             Vagina: No gross lesions or discharge, vaginal atrophy   Cervix: No gross lesions or discharge  Uterus antevertednormal size, shape and consistency, non-tender and mobile  Adnexa  Without masses or tenderness  Anus and  perineum  normal   Rectovaginal  normal sphincter tone without palpated masses or tenderness         Assessment/plan: 58 year old patient with history of osteoporosis we discussed importance of compliance with Prolia. Patient is due for her next injection in March 2016. She will need her bone density study next month. Pap smear was done today. Lab work done by her PCP. We discussed importance of calcium vitamin D and regular exercise for osteoporosis prevention. We discussed importance of monthly self breast examination. Patient will follow-up with her PCP on her hypertension in the event he may need to do an adjustment with her lisinopril.    Terrance Mass MD, 3:04 PM 12/16/2014

## 2014-12-16 NOTE — Patient Instructions (Signed)
Estradiol vaginal tablets What is this medicine? ESTRADIOL (es tra DYE ole) vaginal tablet is used to help relieve symptoms of vaginal irritation and dryness that occurs in some women during menopause. This medicine may be used for other purposes; ask your health care provider or pharmacist if you have questions. COMMON BRAND NAME(S): Vagifem What should I tell my health care provider before I take this medicine? They need to know if you have any of these conditions: -abnormal vaginal bleeding -blood vessel disease or blood clots -breast, cervical, endometrial, ovarian, liver, or uterine cancer -dementia -diabetes -gallbladder disease -heart disease or recent heart attack -high blood pressure -high cholesterol -high level of calcium in the blood -hysterectomy -kidney disease -liver disease -migraine headaches -protein C deficiency -protein S deficiency -stroke -systemic lupus erythematosus (SLE) -tobacco smoker -an unusual or allergic reaction to estrogens, other hormones, medicines, foods, dyes, or preservatives -pregnant or trying to get pregnant -breast-feeding How should I use this medicine? This medicine is only for use in the vagina. Do not take by mouth. Wash your hands before and after use. Read package directions carefully. Unwrap the pre-filled applicator package. Lie on your back, part and bend your knees. Gently insert the applicator tip high in the vagina and push the plunger to release the tablet into the vagina. Gently remove the applicator. Throw away the applicator after use. Do not use your medicine more often than directed. Finish the full course prescribed by your doctor or health care professional even if you think your condition is better. Do not stop using except on the advice of your doctor or health care professional. Talk to your pediatrician regarding the use of this medicine in children. A patient package insert for the product will be given with each  prescription and refill. Read this sheet carefully each time. The sheet may change frequently. Overdosage: If you think you have taken too much of this medicine contact a poison control center or emergency room at once. NOTE: This medicine is only for you. Do not share this medicine with others. What if I miss a dose? If you miss a dose, take it as soon as you can. If it is almost time for your next dose, take only that dose. Do not take double or extra doses. What may interact with this medicine? Do not take this medicine with any of the following medications: -aromatase inhibitors like aminoglutethimide, anastrozole, exemestane, letrozole, testolactone This medicine may also interact with the following medications: -antibiotics used to treat tuberculosis like rifabutin, rifampin and rifapentene -raloxifene or tamoxifen -warfarin This list may not describe all possible interactions. Give your health care provider a list of all the medicines, herbs, non-prescription drugs, or dietary supplements you use. Also tell them if you smoke, drink alcohol, or use illegal drugs. Some items may interact with your medicine. What should I watch for while using this medicine? Visit your health care professional for regular checks on your progress. You will need a regular breast and pelvic exam. You should also discuss the need for regular mammograms with your health care professional, and follow his or her guidelines. This medicine can make your body retain fluid, making your fingers, hands, or ankles swell. Your blood pressure can go up. Contact your doctor or health care professional if you feel you are retaining fluid. If you have any reason to think you are pregnant; stop taking this medicine at once and contact your doctor or health care professional. Tobacco smoking increases the risk of getting  a blood clot or having a stroke, especially if you are more than 58 years old. You are strongly advised not to  smoke. If you wear contact lenses and notice visual changes, or if the lenses begin to feel uncomfortable, consult your eye care specialist. If you are going to have elective surgery, you may need to stop taking this medicine beforehand. Consult your health care professional for advice prior to scheduling the surgery. What side effects may I notice from receiving this medicine? Side effects that you should report to your doctor or health care professional as soon as possible: -allergic reactions like skin rash, itching or hives, swelling of the face, lips, or tongue -breast tissue changes or discharge -changes in vision -chest pain -confusion, trouble speaking or understanding -dark urine -general ill feeling or flu-like symptoms -light-colored stools -nausea, vomiting -pain, swelling, warmth in the leg -right upper belly pain -severe headaches -shortness of breath -sudden numbness or weakness of the face, arm or leg -trouble walking, dizziness, loss of balance or coordination -unusual vaginal bleeding -yellowing of the eyes or skin Side effects that usually do not require medical attention (report to your doctor or health care professional if they continue or are bothersome): -hair loss -increased hunger or thirst -increased urination -symptoms of vaginal infection like itching, irritation or unusual discharge -unusually weak or tired This list may not describe all possible side effects. Call your doctor for medical advice about side effects. You may report side effects to FDA at 1-800-FDA-1088. Where should I keep my medicine? Keep out of the reach of children. Store at room temperature between 15 and 30 degrees C (59 and 86 degrees F). Throw away any unused medicine after the expiration date. NOTE: This sheet is a summary. It may not cover all possible information. If you have questions about this medicine, talk to your doctor, pharmacist, or health care provider.  2015,  Elsevier/Gold Standard. (2011-03-16 09:08:58)

## 2014-12-17 LAB — URINALYSIS W MICROSCOPIC + REFLEX CULTURE
BACTERIA UA: NONE SEEN
Bilirubin Urine: NEGATIVE
CASTS: NONE SEEN
Crystals: NONE SEEN
Glucose, UA: NEGATIVE mg/dL
HGB URINE DIPSTICK: NEGATIVE
KETONES UR: NEGATIVE mg/dL
Leukocytes, UA: NEGATIVE
Nitrite: NEGATIVE
PH: 7.5 (ref 5.0–8.0)
Protein, ur: NEGATIVE mg/dL
Specific Gravity, Urine: 1.005 (ref 1.005–1.030)
Squamous Epithelial / LPF: NONE SEEN
UROBILINOGEN UA: 0.2 mg/dL (ref 0.0–1.0)

## 2014-12-22 LAB — CYTOLOGY - PAP

## 2014-12-29 ENCOUNTER — Other Ambulatory Visit: Payer: Self-pay | Admitting: Gynecology

## 2014-12-29 DIAGNOSIS — M810 Age-related osteoporosis without current pathological fracture: Secondary | ICD-10-CM

## 2014-12-30 ENCOUNTER — Other Ambulatory Visit: Payer: Self-pay | Admitting: Gynecology

## 2014-12-30 NOTE — Telephone Encounter (Signed)
Patient was seen on 12/16/14 refill was never sent to pharmacy.

## 2015-01-01 ENCOUNTER — Encounter: Payer: Self-pay | Admitting: Internal Medicine

## 2015-01-01 ENCOUNTER — Encounter: Payer: Self-pay | Admitting: Gastroenterology

## 2015-01-02 ENCOUNTER — Encounter: Payer: Self-pay | Admitting: Internal Medicine

## 2015-01-15 ENCOUNTER — Ambulatory Visit (INDEPENDENT_AMBULATORY_CARE_PROVIDER_SITE_OTHER): Payer: BC Managed Care – PPO

## 2015-01-15 DIAGNOSIS — M810 Age-related osteoporosis without current pathological fracture: Secondary | ICD-10-CM

## 2015-01-26 ENCOUNTER — Telehealth: Payer: Self-pay | Admitting: *Deleted

## 2015-01-26 MED ORDER — FLUCONAZOLE 150 MG PO TABS: 150.0000 mg | ORAL_TABLET | Freq: Once | ORAL | Status: DC

## 2015-01-26 NOTE — Telephone Encounter (Signed)
Please call in prescription for Diflucan 150 mg 1 by mouth today. If no improvement in her symptoms she will need to be seen in the office by the end of the week.

## 2015-01-26 NOTE — Telephone Encounter (Signed)
Pt informed the below note, Rx sent

## 2015-01-26 NOTE — Telephone Encounter (Signed)
Pt called c/o yeast infection itching, white discharge x 1 week now, asked if Rx could be sent. Pt fully aware OV needed. Please advise

## 2015-02-23 ENCOUNTER — Other Ambulatory Visit: Payer: BC Managed Care – PPO | Admitting: Anesthesiology

## 2015-02-23 DIAGNOSIS — Z1211 Encounter for screening for malignant neoplasm of colon: Secondary | ICD-10-CM

## 2015-02-24 ENCOUNTER — Telehealth: Payer: Self-pay | Admitting: Gynecology

## 2015-02-24 NOTE — Telephone Encounter (Signed)
Left message for pt regarding Prolia. Insurance information sent to Prolia , pt will need Calcium level . Asked pt to check with PCP for blood work and she can fax in the results.  She can have this done at office if needed.

## 2015-03-02 NOTE — Telephone Encounter (Signed)
Phone call message from [facility], Bun 6.0 , Creatinine 0.65, Calcium 9.4 from 09/17/2014 , documentation in chart. Waiting on insurance information.

## 2015-03-25 NOTE — Telephone Encounter (Signed)
Phone call to [facility] , she will schedule injection. Benefits are No deduct, Co-Pay $30 with Prolia injection with or without office visit. No co insurance  Or OOP Max applies. She will call and schedule with Claudia. Injection due 03/26/2015. PA received  Auth # [id number] valid from 03/17/2015 to 03/16/2016. Only approved for one injection. Also call [place] on 03/24/15 incorrect information from [employer] regarding insurance card, [employer] and had them re-submit information for approval.

## 2015-04-07 NOTE — Telephone Encounter (Addendum)
Prolia injection scheduled 04/09/15. Prolia injection given. Next injection due after 10/10/15. Pt will need new PA due to only one injection approved. Will also need calcium level previous (09/17/14)

## 2015-04-09 ENCOUNTER — Ambulatory Visit (INDEPENDENT_AMBULATORY_CARE_PROVIDER_SITE_OTHER): Payer: BC Managed Care – PPO | Admitting: *Deleted

## 2015-04-09 DIAGNOSIS — M810 Age-related osteoporosis without current pathological fracture: Secondary | ICD-10-CM | POA: Diagnosis not present

## 2015-04-09 MED ORDER — DENOSUMAB 60 MG/ML ~~LOC~~ SOLN
60.0000 mg | Freq: Once | SUBCUTANEOUS | Status: AC
  Administered 2015-04-09: 60 mg via SUBCUTANEOUS

## 2015-04-10 ENCOUNTER — Telehealth: Payer: Self-pay | Admitting: *Deleted

## 2015-04-10 ENCOUNTER — Telehealth: Payer: Self-pay | Admitting: Gynecology

## 2015-04-10 NOTE — Telephone Encounter (Signed)
Dr.Fernandez I just wanted to inform you with the below as well.

## 2015-04-10 NOTE — Telephone Encounter (Signed)
Pt informed with the below note, will relay to Dr.Fernandez as well

## 2015-04-10 NOTE — Telephone Encounter (Signed)
I would continue to treat them symptomatically with the Tylenol or ibuprofen. They should resolve over the next day or 2. Make sure she discusses this with Dr. Toney Rakes before next scheduled shot.

## 2015-04-10 NOTE — Telephone Encounter (Signed)
(  JF patient) you spoke with patient on call last night. Pt calling to follow up from telephone encounter, still having chills and muscles aches, no fever, has a headache as well. Pt said she feels a lot better than last night, but symptoms still there. Asked for recommendations? Please advise

## 2015-04-10 NOTE — Telephone Encounter (Signed)
On Call Note 12:00 AM :  Husband called for wife.  Received Prolia shot today and now with muscle aches and headache. Received Prolia 4 times previously without side effects.  Recommended tylenol/ibuprofen with office call in AM if continues.  Alert physician before next injection.

## 2015-06-22 ENCOUNTER — Other Ambulatory Visit: Payer: Self-pay

## 2015-06-22 DIAGNOSIS — Z1231 Encounter for screening mammogram for malignant neoplasm of breast: Secondary | ICD-10-CM

## 2015-07-23 ENCOUNTER — Ambulatory Visit: Payer: BC Managed Care – PPO

## 2015-08-14 ENCOUNTER — Ambulatory Visit
Admission: RE | Admit: 2015-08-14 | Discharge: 2015-08-14 | Disposition: A | Discharge status: Home / Self Care | Payer: BC Managed Care – PPO | Source: Ambulatory Visit

## 2015-08-14 DIAGNOSIS — Z1231 Encounter for screening mammogram for malignant neoplasm of breast: Secondary | ICD-10-CM

## 2015-09-22 ENCOUNTER — Telehealth: Payer: Self-pay | Admitting: Gynecology

## 2015-09-22 NOTE — Telephone Encounter (Signed)
Prolia due after 10/10/15 insurance benefits require PA. She had blood work with PCP she will fax or drop by results.

## 2015-09-23 ENCOUNTER — Ambulatory Visit: Payer: BC Managed Care – PPO

## 2015-09-23 ENCOUNTER — Telehealth: Payer: Self-pay | Admitting: Gynecology

## 2015-09-23 NOTE — Telephone Encounter (Signed)
Approval for Prolia until 03/16/16 Auth # [id number]. Pt informed and apt made for 10/14/15 1030am KW CMA

## 2015-09-23 NOTE — Telephone Encounter (Signed)
Will need Calcium level prior to Prolia in Oct. 2016

## 2015-09-24 ENCOUNTER — Encounter: Payer: Self-pay | Admitting: Gynecology

## 2015-10-02 NOTE — Telephone Encounter (Signed)
[name] knows we need a copy of bloodwork or get bloodwork done before receiving Prolia inj KW CMA

## 2015-10-06 NOTE — Telephone Encounter (Addendum)
Pt was scheduled on 09/23/15 incorrect, PC to remind her to call and reschedule and bring copy of lab work . Need calcium level. Insurance coverage benefits are

## 2015-10-13 NOTE — Telephone Encounter (Signed)
Injection Prolia scheduled for 10/13/15 with pt to bring copy of lab work from PCP

## 2015-10-14 ENCOUNTER — Ambulatory Visit (INDEPENDENT_AMBULATORY_CARE_PROVIDER_SITE_OTHER): Payer: BC Managed Care – PPO | Admitting: Anesthesiology

## 2015-10-14 DIAGNOSIS — M810 Age-related osteoporosis without current pathological fracture: Secondary | ICD-10-CM | POA: Diagnosis not present

## 2015-10-14 MED ORDER — DENOSUMAB 60 MG/ML ~~LOC~~ SOLN
60.0000 mg | Freq: Once | SUBCUTANEOUS | Status: AC
  Administered 2015-10-14: 60 mg via SUBCUTANEOUS

## 2015-10-14 NOTE — Telephone Encounter (Signed)
Prolia today . Calcium level from PCP 9.4 09/22/15. Next Prolia injection after 04/14/16

## 2015-10-26 ENCOUNTER — Encounter: Payer: Self-pay | Admitting: Gynecology

## 2016-01-12 ENCOUNTER — Emergency Department (HOSPITAL_COMMUNITY): Payer: BC Managed Care – PPO

## 2016-01-12 ENCOUNTER — Encounter (HOSPITAL_COMMUNITY): Payer: Self-pay | Admitting: Emergency Medicine

## 2016-01-12 ENCOUNTER — Emergency Department (HOSPITAL_COMMUNITY)
Admission: EM | Admit: 2016-01-12 | Discharge: 2016-01-13 | Disposition: A | Discharge status: Home / Self Care | Payer: BC Managed Care – PPO | Attending: Emergency Medicine | Admitting: Emergency Medicine

## 2016-01-12 DIAGNOSIS — W108XXA Fall (on) (from) other stairs and steps, initial encounter: Secondary | ICD-10-CM | POA: Diagnosis not present

## 2016-01-12 DIAGNOSIS — Z8742 Personal history of other diseases of the female genital tract: Secondary | ICD-10-CM | POA: Diagnosis not present

## 2016-01-12 DIAGNOSIS — Z8719 Personal history of other diseases of the digestive system: Secondary | ICD-10-CM | POA: Diagnosis not present

## 2016-01-12 DIAGNOSIS — Y9289 Other specified places as the place of occurrence of the external cause: Secondary | ICD-10-CM | POA: Insufficient documentation

## 2016-01-12 DIAGNOSIS — Z87891 Personal history of nicotine dependence: Secondary | ICD-10-CM | POA: Diagnosis not present

## 2016-01-12 DIAGNOSIS — S99922A Unspecified injury of left foot, initial encounter: Secondary | ICD-10-CM | POA: Diagnosis not present

## 2016-01-12 DIAGNOSIS — S99912A Unspecified injury of left ankle, initial encounter: Secondary | ICD-10-CM | POA: Diagnosis present

## 2016-01-12 DIAGNOSIS — Y9389 Activity, other specified: Secondary | ICD-10-CM | POA: Diagnosis not present

## 2016-01-12 DIAGNOSIS — S92002A Unspecified fracture of left calcaneus, initial encounter for closed fracture: Secondary | ICD-10-CM | POA: Diagnosis not present

## 2016-01-12 DIAGNOSIS — Z8739 Personal history of other diseases of the musculoskeletal system and connective tissue: Secondary | ICD-10-CM | POA: Insufficient documentation

## 2016-01-12 DIAGNOSIS — Y998 Other external cause status: Secondary | ICD-10-CM | POA: Diagnosis not present

## 2016-01-12 DIAGNOSIS — Z79899 Other long term (current) drug therapy: Secondary | ICD-10-CM | POA: Diagnosis not present

## 2016-01-12 DIAGNOSIS — Z86010 Personal history of colonic polyps: Secondary | ICD-10-CM | POA: Insufficient documentation

## 2016-01-12 DIAGNOSIS — I10 Essential (primary) hypertension: Secondary | ICD-10-CM | POA: Diagnosis not present

## 2016-01-12 NOTE — ED Notes (Signed)
Per EMS: pt from home for eval of fall down 7 steps while carrying a TV, pt noted to have deformity to posterior left ankle with bruising. Pt also noted to have good distal pulses. Pt given 10mg  of morphine and 4mg  of zofran en route. nad noted at this time.

## 2016-01-12 NOTE — ED Notes (Signed)
Otho tech paged and requested for [employer] - per request of Dr. Betsey Holiday.

## 2016-01-12 NOTE — ED Notes (Signed)
All of patient belongings given to husband at bedside. Pt tearful and noted to have rapid breathing about xray results stating she has to do cooking and planning events for this weekend. RN coached pt on breathing, pt calm at this time.

## 2016-01-12 NOTE — ED Provider Notes (Signed)
CSN: [id number]     Arrival date & time 01/12/16  2146 History   First MD Initiated Contact with Patient 01/12/16 2257     Chief Complaint  Patient presents with  . Fall  . Ankle Pain     (Consider location/radiation/quality/duration/timing/severity/associated sxs/prior Treatment) Patient is a 60 y.o. female presenting with fall and ankle pain. The history is provided by the patient.  Fall This is a new problem. The current episode started today. The problem occurs constantly. The problem has been gradually worsening. Associated symptoms include arthralgias.  Ankle Pain  [name] is a 60 y.o. female who presents to the ED with left ankle pain after falling down 7 steps while carrying a TV. Patient arrived via EMS and was given Morphine prior to arrival to the ED. Pain is under control. Patient denies any other injuries.  Past Medical History  Diagnosis Date  . Endometrial polyp   . Osteoporosis   . Colon polyps     Benign  . Hypertension   . Breast fibrocystic disorder   . Acid reflux    Past Surgical History  Procedure Laterality Date  . Hysteroscopy  2005  . Dilation and curettage of uterus  2005  . Nasal septum surgery    . Tonsillectomy    . Cesarean section      X 2  . Nissen fundoplication     Family History  Problem Relation Age of Onset  . Diabetes Father   . Hypertension Father   . Heart disease Father   . Diabetes Paternal Grandfather   . Heart disease Paternal Grandfather   . Diabetes Paternal Uncle    Social History  Substance Use Topics  . Smoking status: Former Research scientist (life sciences)  . Smokeless tobacco: Never Used  . Alcohol Use: 3.0 oz/week    6 Standard drinks or equivalent per week   OB History    Gravida Para Term Preterm AB TAB SAB Ectopic Multiple Living   2 2 2       2      Review of Systems  Musculoskeletal: Positive for arthralgias.       Left foot and ankle pain  all other systems negative    Allergies  Prednisone  Home Medications    Prior to Admission medications   Medication Sig Start Date End Date Taking? Authorizing Provider  ALPRAZolam Duanne Moron) 0.25 MG tablet Take 1 tablet (0.25 mg total) by mouth as needed for anxiety or sleep. 12/12/13  Yes Terrance Mass, MD  cholecalciferol (VITAMIN D) 1000 UNITS tablet Take 1,000 Units by mouth daily.   Yes Historical Provider, MD  denosumab (PROLIA) 60 MG/ML SOLN injection Inject 60 mg into the skin every 6 (six) months. Administer in upper arm, thigh, or abdomen   Yes Historical Provider, MD  lisinopril (PRINIVIL,ZESTRIL) 5 MG tablet Take 5 mg by mouth daily.     Yes Historical Provider, MD  Melatonin 5 MG TABS Take 5 mg by mouth at bedtime.    Yes Historical Provider, MD  Multiple Vitamin (MULTIVITAMIN) tablet Take 1 tablet by mouth daily.     Yes Historical Provider, MD  VAGIFEM 10 MCG TABS vaginal tablet INSERT ONE INTO VAGINA FIVE TIMES WEEKLY 12/30/14  Yes Terrance Mass, MD  vitamin C (ASCORBIC ACID) 500 MG tablet Take 500 mg by mouth daily.    Yes Historical Provider, MD   BP 110/86 mmHg  Pulse 87  Temp(Src) 98.1 F (36.7 C) (Oral)  Resp 18  SpO2  100% Physical Exam  Constitutional: She is oriented to person, place, and time. She appears well-developed and well-nourished. No distress.  HENT:  Head: Normocephalic.  Eyes: EOM are normal.  Neck: Neck supple.  Cardiovascular: Normal rate.   Pulmonary/Chest: Effort normal.  Musculoskeletal:       Left ankle: She exhibits decreased range of motion, swelling and deformity. She exhibits no laceration and normal pulse. Tenderness. Lateral malleolus and medial malleolus tenderness found. Achilles tendon exhibits pain.  Neurological: She is alert and oriented to person, place, and time. No cranial nerve deficit.  Skin: Skin is warm and dry.  Psychiatric: She has a normal mood and affect. Her behavior is normal.  Nursing note and vitals reviewed.   ED Course  Procedures (including critical care time) Labs  Review Labs Reviewed - No data to display  Imaging Review Dg Ankle Complete Left  01/12/2016  CLINICAL DATA:  Status post fall down several steps, with pain and deformity at the posterior left ankle. Initial encounter. EXAM: LEFT ANKLE COMPLETE - 3+ VIEW COMPARISON:  None. FINDINGS: There is an unusual markedly displaced and mildly comminuted fracture of the proximal aspect of the posterior calcaneus. The fracture fragment extends nearly to the skin surface. The underlying Achilles tendon is not well assessed, as diffuse soft tissue edema is noted about the fracture site. There is also a horizontal fracture through the proximal fifth metatarsal. An ankle joint effusion is noted. The subtalar joint is grossly unremarkable. A small os trigonum is noted. A plantar calcaneal spur is seen. IMPRESSION: 1. Unusual markedly displaced and mildly comminuted fracture of the proximal aspect of the posterior calcaneus. The fracture fragment extends nearly to the skin surface. The underlying Achilles tendon is not well assessed, as diffuse soft tissue edema is noted about the fracture site. 2. Horizontal fracture through the proximal fifth metatarsal. 3. Ankle joint effusion noted. 4. Small os trigonum noted. Electronically Signed   By: Garald Balding M.D.   On: 01/12/2016 22:41   Dg Foot Complete Left  01/12/2016  CLINICAL DATA:  60 year old female with history of fall down several steps tonight complaining of pain in the left foot and ankle. EXAM: LEFT FOOT - COMPLETE 3+ VIEW COMPARISON:  No priors. FINDINGS: Nondisplaced fracture through the base of the fifth metatarsal. Displaced mildly comminuted avulsion fracture from the dorsal aspect of the calcaneus, with a up to 3.7 cm of proximal migration of the fracture fragment. Remaining bones of the foot otherwise appear intact. IMPRESSION: 1. Displaced mildly comminuted avulsion fracture of the dorsal aspect of the calcaneus, as above. 2. Nondisplaced fractures through  the base of the fifth metatarsal. Electronically Signed   By: Vinnie Langton M.D.   On: 01/12/2016 22:34   I have personally reviewed and evaluated these images as part of my medical decision-making.   MDM   Medical screening exam complete and Dr. Betsey Holiday to assume care of the patient. Patient moved to [facility] room 13.       [place], NP 01/12/16 2308  Orpah Greek, MD 01/13/16 [phone]

## 2016-01-12 NOTE — ED Notes (Signed)
Ankle splint applied PTA by [facility]

## 2016-01-12 NOTE — ED Notes (Signed)
Pt brought back to room from xray.

## 2016-01-13 ENCOUNTER — Other Ambulatory Visit (HOSPITAL_COMMUNITY): Payer: Self-pay | Admitting: Orthopaedic Surgery

## 2016-01-13 MED ORDER — DIPHENHYDRAMINE HCL 25 MG PO CAPS
25.0000 mg | ORAL_CAPSULE | Freq: Once | ORAL | Status: AC
  Administered 2016-01-13: 25 mg via ORAL
  Filled 2016-01-13: qty 1

## 2016-01-13 MED ORDER — OXYCODONE-ACETAMINOPHEN 5-325 MG PO TABS: 1.0000 | ORAL_TABLET | ORAL | Status: DC | PRN

## 2016-01-13 MED ORDER — OXYCODONE-ACETAMINOPHEN 5-325 MG PO TABS
1.0000 | ORAL_TABLET | Freq: Once | ORAL | Status: AC
  Administered 2016-01-13: 1 via ORAL
  Filled 2016-01-13: qty 1

## 2016-01-13 NOTE — ED Notes (Signed)
Pt stable, ambulatory, states understanding of discharge instructions 

## 2016-01-13 NOTE — Discharge Instructions (Signed)
Calcaneal Fracture Repair  There are many different ways of treating fractures of the large irregular bone in the foot that makes up the heel of the foot (calcaneus). Calcaneal fractures can be treated with:    Immobilization--The fracture is casted as it is without changing the positions of the fracture involved.    Closed reduction--The bones are manipulated back into position without opening the site of the fracture using surgery.    Open reduction and internal fixation--The fracture site is opened and the bone pieces are fixed into place with some type of hardware (such as a screw).    Primary arthrodesis--The joint has enough damage that a procedure is done as the first treatment which will leave the joint permanently stiff. This will decrease function, however usually will leave the joint pain free.  LET YOUR HEALTH CARE PROVIDER KNOW ABOUT:   Any allergies you have.    All medicines you are taking, including vitamins, herbs, eye drops, creams, and over-the-counter medicines.    Previous problems you or members of your family have had with the use of anesthetics.   Any blood disorders you have.    Previous surgeries you have had.    Medical conditions you have.   RISKS AND COMPLICATIONS  Generally, calcaneal fracture repair is a safe procedure. However, as with any procedure, complications can occur. Possible complications include:    Swelling of the foot and ankle.   Infection of the wound or bone.   Arthritis.   Chronic pain of the foot.   Nerve injury.   Blood clot in the legs or lungs.  BEFORE THE PROCEDURE   Ask your health care provider about changing or stopping your regular medicines. You may need to stop taking certain medicines, such as aspirin or blood thinners, at least 1 week before the surgery.   X-rays and any imaging studies are reviewed with your healthcare provider. The surgeon will advise you on the best surgical approach to repair your fracture.   Do not eat or  drink anything for at least 8 hours before the surgery or as directed by your health care provider.    If you smoke, do not smoke for at least 2 weeks before the surgery.    Make plans to have someone drive you home after the procedure. Also arrange for someone to help you with activities during recovery.   PROCEDURE    You will be given medicine to help you relax (sedative). You will then be given medicine to make you sleep through the procedure (general anesthetic). These medicines will be given through an IV access tube that is put into one of your veins.    A nerve block or numbing medicine (local anesthetic) may also be used to keep you comfortable.   Once you are asleep, the foot will be cleaned and shaved if needed.   The surgeon may use a percutaneous or open technique for this surgery:    In the percutaneous approach, small cuts and pins are used to repair the fracture.    In the open technique, a cut is made along the outside of the foot and the bone pieces are placed back together with hardware. A drain may be left to collect fluid. It is removed 3-4 days after the procedure.   The surgeon then uses staples or stitches to close the incision or cuts.  AFTER THE PROCEDURE   After surgery you will be taken to the recovery area where a nurse will   watch and check your progress for 1-3 hours. Once you are awake, stable, and taking fluids well, and if you do not have any other problems, you will be allowed to go home.   You will be given pain medicine if needed.   The IV access tube will be removed before you are discharged.     This information is not intended to replace advice given to you by your health care provider. Make sure you discuss any questions you have with your health care provider.     Document Released: 09/21/2005 Document Revised: 10/02/2013 Document Reviewed: 07/16/2013  Elsevier Interactive Patient Education 2016 [employer]

## 2016-01-14 ENCOUNTER — Encounter (HOSPITAL_COMMUNITY): Payer: Self-pay | Admitting: *Deleted

## 2016-01-14 MED ORDER — CEFAZOLIN SODIUM-DEXTROSE 2-3 GM-% IV SOLR
2.0000 g | INTRAVENOUS | Status: AC
  Administered 2016-01-15: 2 g via INTRAVENOUS
  Filled 2016-01-14: qty 50

## 2016-01-14 MED ORDER — CHLORHEXIDINE GLUCONATE 4 % EX LIQD: 60.0000 mL | Freq: Once | CUTANEOUS | Status: DC

## 2016-01-14 NOTE — H&P (Signed)
[name] is an 60 y.o. female.   Chief Complaint: left calcaneal fracture HPI:  Patient fell down several steps.  Unable to weight bear after injury.    Past Medical History  Diagnosis Date  . Endometrial polyp   . Osteoporosis   . Colon polyps     Benign  . Hypertension   . Breast fibrocystic disorder   . Acid reflux     Past Surgical History  Procedure Laterality Date  . Hysteroscopy  2005  . Dilation and curettage of uterus  2005  . Nasal septum surgery    . Tonsillectomy    . Cesarean section      X 2  . Nissen fundoplication      Family History  Problem Relation Age of Onset  . Diabetes Father   . Hypertension Father   . Heart disease Father   . Diabetes Paternal Grandfather   . Heart disease Paternal Grandfather   . Diabetes Paternal Uncle    Social History:  reports that she has quit smoking. She has never used smokeless tobacco. She reports that she drinks about 3.0 oz of alcohol per week. Her drug history is not on file.  Allergies:  Allergies  Allergen Reactions  . Prednisone     No prescriptions prior to admission    No results found for this or any previous visit (from the past 48 hour(s)). Dg Ankle Complete Left  01/12/2016  CLINICAL DATA:  Status post fall down several steps, with pain and deformity at the posterior left ankle. Initial encounter. EXAM: LEFT ANKLE COMPLETE - 3+ VIEW COMPARISON:  None. FINDINGS: There is an unusual markedly displaced and mildly comminuted fracture of the proximal aspect of the posterior calcaneus. The fracture fragment extends nearly to the skin surface. The underlying Achilles tendon is not well assessed, as diffuse soft tissue edema is noted about the fracture site. There is also a horizontal fracture through the proximal fifth metatarsal. An ankle joint effusion is noted. The subtalar joint is grossly unremarkable. A small os trigonum is noted. A plantar calcaneal spur is seen. IMPRESSION: 1. Unusual markedly  displaced and mildly comminuted fracture of the proximal aspect of the posterior calcaneus. The fracture fragment extends nearly to the skin surface. The underlying Achilles tendon is not well assessed, as diffuse soft tissue edema is noted about the fracture site. 2. Horizontal fracture through the proximal fifth metatarsal. 3. Ankle joint effusion noted. 4. Small os trigonum noted. Electronically Signed   By: Garald Balding M.D.   On: 01/12/2016 22:41   Dg Foot Complete Left  01/12/2016  CLINICAL DATA:  60 year old female with history of fall down several steps tonight complaining of pain in the left foot and ankle. EXAM: LEFT FOOT - COMPLETE 3+ VIEW COMPARISON:  No priors. FINDINGS: Nondisplaced fracture through the base of the fifth metatarsal. Displaced mildly comminuted avulsion fracture from the dorsal aspect of the calcaneus, with a up to 3.7 cm of proximal migration of the fracture fragment. Remaining bones of the foot otherwise appear intact. IMPRESSION: 1. Displaced mildly comminuted avulsion fracture of the dorsal aspect of the calcaneus, as above. 2. Nondisplaced fractures through the base of the fifth metatarsal. Electronically Signed   By: Vinnie Langton M.D.   On: 01/12/2016 22:34    Review of Systems  Constitutional: Negative.   HENT: Negative.   Eyes: Negative.   Respiratory: Negative.   Cardiovascular: Negative.   Gastrointestinal: Negative.   Genitourinary: Negative.   Musculoskeletal:  Positive for joint pain and falls.  Neurological: Negative.   Psychiatric/Behavioral: Negative.     There were no vitals taken for this visit. Physical Exam  Constitutional: She is oriented to person, place, and time. No distress.  HENT:  Head: Atraumatic.  Eyes: EOM are normal.  Neck: Normal range of motion.  Cardiovascular: Normal rate.   Respiratory: No respiratory distress.  GI: She exhibits no distension.  Musculoskeletal: She exhibits tenderness.  Neurological: She is alert and  oriented to person, place, and time.  Skin: Skin is warm and dry.  Psychiatric: She has a normal mood and affect.    Xray: There is an unusual markedly displaced and mildly comminuted fracture of the proximal aspect of the posterior calcaneus. The fracture fragment extends nearly to the skin surface. The underlying Achilles tendon is not well assessed, as diffuse soft tissue edema is noted about the fracture site.  There is also a horizontal fracture through the proximal fifth metatarsal. An ankle joint effusion is noted. The subtalar joint is grossly unremarkable. A small os trigonum is noted. A plantar calcaneal spur is seen.  IMPRESSION: 1. Unusual markedly displaced and mildly comminuted fracture of the proximal aspect of the posterior calcaneus. The fracture fragment extends nearly to the skin surface. The underlying Achilles tendon is not well assessed, as diffuse soft tissue edema is noted about the fracture site. 2. Horizontal fracture through the proximal fifth metatarsal. 3. Ankle joint effusion noted. 4. Small os trigonum noted.  Assessment/Plan Left calcaneal fracture. Patient advised that best treatment option is ORIF procedure. Surgical procedure along with possible risks and complications discussed.  All questions answered.  Wishes to proceed.  Regginald Pask M 01/14/2016, 11:38 AM

## 2016-01-15 ENCOUNTER — Ambulatory Visit (HOSPITAL_COMMUNITY): Payer: BC Managed Care – PPO | Admitting: Anesthesiology

## 2016-01-15 ENCOUNTER — Ambulatory Visit (HOSPITAL_COMMUNITY): Payer: BC Managed Care – PPO

## 2016-01-15 ENCOUNTER — Observation Stay (HOSPITAL_COMMUNITY): Payer: BC Managed Care – PPO

## 2016-01-15 ENCOUNTER — Encounter (HOSPITAL_COMMUNITY)
Admission: RE | Disposition: A | Discharge status: Home / Self Care | Payer: Self-pay | Source: Ambulatory Visit | Attending: Orthopaedic Surgery

## 2016-01-15 ENCOUNTER — Encounter (HOSPITAL_COMMUNITY): Payer: Self-pay | Admitting: Surgery

## 2016-01-15 ENCOUNTER — Observation Stay (HOSPITAL_COMMUNITY)
Admission: RE | Admit: 2016-01-15 | Discharge: 2016-01-16 | Disposition: A | Discharge status: Home / Self Care | Payer: BC Managed Care – PPO | Source: Ambulatory Visit | Attending: Orthopaedic Surgery | Admitting: Orthopaedic Surgery

## 2016-01-15 DIAGNOSIS — W109XXA Fall (on) (from) unspecified stairs and steps, initial encounter: Secondary | ICD-10-CM | POA: Diagnosis not present

## 2016-01-15 DIAGNOSIS — Z419 Encounter for procedure for purposes other than remedying health state, unspecified: Secondary | ICD-10-CM

## 2016-01-15 DIAGNOSIS — K219 Gastro-esophageal reflux disease without esophagitis: Secondary | ICD-10-CM | POA: Diagnosis not present

## 2016-01-15 DIAGNOSIS — Z87891 Personal history of nicotine dependence: Secondary | ICD-10-CM | POA: Diagnosis not present

## 2016-01-15 DIAGNOSIS — I10 Essential (primary) hypertension: Secondary | ICD-10-CM | POA: Insufficient documentation

## 2016-01-15 DIAGNOSIS — M810 Age-related osteoporosis without current pathological fracture: Secondary | ICD-10-CM | POA: Insufficient documentation

## 2016-01-15 DIAGNOSIS — S92002A Unspecified fracture of left calcaneus, initial encounter for closed fracture: Principal | ICD-10-CM | POA: Insufficient documentation

## 2016-01-15 DIAGNOSIS — S92009A Unspecified fracture of unspecified calcaneus, initial encounter for closed fracture: Secondary | ICD-10-CM | POA: Diagnosis present

## 2016-01-15 HISTORY — PX: ORIF CALCANEOUS FRACTURE: SHX5030

## 2016-01-15 HISTORY — DX: Deviated nasal septum: J34.2

## 2016-01-15 LAB — CBC
HEMATOCRIT: 38.7 % (ref 36.0–46.0)
HEMOGLOBIN: 13.1 g/dL (ref 12.0–15.0)
MCH: 31 pg (ref 26.0–34.0)
MCHC: 33.9 g/dL (ref 30.0–36.0)
MCV: 91.5 fL (ref 78.0–100.0)
Platelets: 193 10*3/uL (ref 150–400)
RBC: 4.23 MIL/uL (ref 3.87–5.11)
RDW: 13.3 % (ref 11.5–15.5)
WBC: 6.8 10*3/uL (ref 4.0–10.5)

## 2016-01-15 LAB — COMPREHENSIVE METABOLIC PANEL
ALK PHOS: 37 U/L — AB (ref 38–126)
ALT: 22 U/L (ref 14–54)
AST: 33 U/L (ref 15–41)
Albumin: 3.9 g/dL (ref 3.5–5.0)
Anion gap: 8 (ref 5–15)
BILIRUBIN TOTAL: 0.5 mg/dL (ref 0.3–1.2)
BUN: 9 mg/dL (ref 6–20)
CALCIUM: 9.4 mg/dL (ref 8.9–10.3)
CO2: 26 mmol/L (ref 22–32)
CREATININE: 0.62 mg/dL (ref 0.44–1.00)
Chloride: 109 mmol/L (ref 101–111)
GFR calc Af Amer: >60 mL/min (ref >60)
GLUCOSE: 101 mg/dL — AB (ref 65–99)
POTASSIUM: 4.3 mmol/L (ref 3.5–5.1)
Sodium: 143 mmol/L (ref 135–145)
TOTAL PROTEIN: 6.2 g/dL — AB (ref 6.5–8.1)

## 2016-01-15 LAB — PROTIME-INR
INR: 1 (ref 0.00–1.49)
PROTHROMBIN TIME: 13.4 s (ref 11.6–15.2)

## 2016-01-15 SURGERY — OPEN REDUCTION INTERNAL FIXATION (ORIF) CALCANEOUS FRACTURE
Anesthesia: General | Site: Foot | Laterality: Left

## 2016-01-15 MED ORDER — METOCLOPRAMIDE HCL 5 MG/ML IJ SOLN: 5.0000 mg | Freq: Three times a day (TID) | INTRAMUSCULAR | Status: DC | PRN

## 2016-01-15 MED ORDER — ONDANSETRON HCL 4 MG PO TABS: 4.0000 mg | ORAL_TABLET | Freq: Four times a day (QID) | ORAL | Status: DC | PRN

## 2016-01-15 MED ORDER — HYDROMORPHONE HCL 1 MG/ML IJ SOLN
0.5000 mg | INTRAMUSCULAR | Status: AC | PRN
  Administered 2016-01-15 (×4): 0.5 mg via INTRAVENOUS

## 2016-01-15 MED ORDER — LACTATED RINGERS IV SOLN
INTRAVENOUS | Status: DC
  Administered 2016-01-15: 10:00:00 via INTRAVENOUS

## 2016-01-15 MED ORDER — HYDROMORPHONE HCL 1 MG/ML IJ SOLN
INTRAMUSCULAR | Status: AC
  Filled 2016-01-15: qty 1

## 2016-01-15 MED ORDER — CEFAZOLIN SODIUM 1-5 GM-% IV SOLN
1.0000 g | Freq: Three times a day (TID) | INTRAVENOUS | Status: AC
  Administered 2016-01-15 – 2016-01-16 (×2): 1 g via INTRAVENOUS
  Filled 2016-01-15 (×2): qty 50

## 2016-01-15 MED ORDER — OXYCODONE HCL 5 MG PO TABS
5.0000 mg | ORAL_TABLET | ORAL | Status: DC | PRN
Start: 2016-01-15 — End: 2016-01-16
  Administered 2016-01-15 – 2016-01-16 (×4): 10 mg via ORAL
  Filled 2016-01-15 (×4): qty 2

## 2016-01-15 MED ORDER — ONDANSETRON HCL 4 MG/2ML IJ SOLN
INTRAMUSCULAR | Status: DC | PRN
  Administered 2016-01-15: 4 mg via INTRAVENOUS

## 2016-01-15 MED ORDER — FENTANYL CITRATE (PF) 250 MCG/5ML IJ SOLN
INTRAMUSCULAR | Status: AC
  Filled 2016-01-15: qty 5

## 2016-01-15 MED ORDER — ONDANSETRON HCL 4 MG/2ML IJ SOLN: 4.0000 mg | Freq: Once | INTRAMUSCULAR | Status: DC | PRN

## 2016-01-15 MED ORDER — 0.9 % SODIUM CHLORIDE (POUR BTL) OPTIME
TOPICAL | Status: DC | PRN
  Administered 2016-01-15: 1000 mL

## 2016-01-15 MED ORDER — SUCCINYLCHOLINE CHLORIDE 20 MG/ML IJ SOLN
INTRAMUSCULAR | Status: DC | PRN
  Administered 2016-01-15: 90 mg via INTRAVENOUS

## 2016-01-15 MED ORDER — LACTATED RINGERS IV SOLN
INTRAVENOUS | Status: DC | PRN
  Administered 2016-01-15 (×2): via INTRAVENOUS

## 2016-01-15 MED ORDER — ARTIFICIAL TEARS OP OINT
TOPICAL_OINTMENT | OPHTHALMIC | Status: AC
  Filled 2016-01-15: qty 3.5

## 2016-01-15 MED ORDER — ONDANSETRON HCL 4 MG/2ML IJ SOLN
INTRAMUSCULAR | Status: AC
  Filled 2016-01-15: qty 4

## 2016-01-15 MED ORDER — METOCLOPRAMIDE HCL 5 MG PO TABS: 5.0000 mg | ORAL_TABLET | Freq: Three times a day (TID) | ORAL | Status: DC | PRN

## 2016-01-15 MED ORDER — HYDROMORPHONE HCL 1 MG/ML IJ SOLN
1.0000 mg | INTRAMUSCULAR | Status: DC | PRN
  Administered 2016-01-15 – 2016-01-16 (×3): 1 mg via INTRAVENOUS
  Filled 2016-01-15 (×3): qty 1

## 2016-01-15 MED ORDER — ACETAMINOPHEN 650 MG RE SUPP: 650.0000 mg | Freq: Four times a day (QID) | RECTAL | Status: DC | PRN

## 2016-01-15 MED ORDER — ALPRAZOLAM 0.25 MG PO TABS: 0.2500 mg | ORAL_TABLET | ORAL | Status: DC | PRN

## 2016-01-15 MED ORDER — LIDOCAINE HCL (CARDIAC) 20 MG/ML IV SOLN
INTRAVENOUS | Status: DC | PRN
  Administered 2016-01-15: 60 mg via INTRAVENOUS

## 2016-01-15 MED ORDER — BUPIVACAINE HCL (PF) 0.25 % IJ SOLN
INTRAMUSCULAR | Status: AC
  Filled 2016-01-15: qty 30

## 2016-01-15 MED ORDER — DOCUSATE SODIUM 100 MG PO CAPS
100.0000 mg | ORAL_CAPSULE | Freq: Two times a day (BID) | ORAL | Status: DC
  Administered 2016-01-15 – 2016-01-16 (×2): 100 mg via ORAL
  Filled 2016-01-15 (×2): qty 1

## 2016-01-15 MED ORDER — ONDANSETRON HCL 4 MG/2ML IJ SOLN: 4.0000 mg | Freq: Four times a day (QID) | INTRAMUSCULAR | Status: DC | PRN

## 2016-01-15 MED ORDER — BUPIVACAINE HCL (PF) 0.25 % IJ SOLN
INTRAMUSCULAR | Status: DC | PRN
  Administered 2016-01-15: 7 mL

## 2016-01-15 MED ORDER — LIDOCAINE HCL (CARDIAC) 20 MG/ML IV SOLN
INTRAVENOUS | Status: AC
  Filled 2016-01-15: qty 5

## 2016-01-15 MED ORDER — OXYCODONE-ACETAMINOPHEN 5-325 MG PO TABS: 1.0000 | ORAL_TABLET | ORAL | Status: DC | PRN

## 2016-01-15 MED ORDER — PROPOFOL 10 MG/ML IV BOLUS
INTRAVENOUS | Status: DC | PRN
Start: 2016-01-15 — End: 2016-01-15
  Administered 2016-01-15: 20 mg via INTRAVENOUS
  Administered 2016-01-15: 180 mg via INTRAVENOUS

## 2016-01-15 MED ORDER — BUPIVACAINE HCL (PF) 0.5 % IJ SOLN
INTRAMUSCULAR | Status: AC
  Filled 2016-01-15: qty 30

## 2016-01-15 MED ORDER — FENTANYL CITRATE (PF) 100 MCG/2ML IJ SOLN
INTRAMUSCULAR | Status: AC
  Filled 2016-01-15: qty 2

## 2016-01-15 MED ORDER — LISINOPRIL 5 MG PO TABS
5.0000 mg | ORAL_TABLET | Freq: Every day | ORAL | Status: DC
  Administered 2016-01-15 – 2016-01-16 (×2): 5 mg via ORAL
  Filled 2016-01-15 (×2): qty 1

## 2016-01-15 MED ORDER — FENTANYL CITRATE (PF) 100 MCG/2ML IJ SOLN
INTRAMUSCULAR | Status: DC | PRN
  Administered 2016-01-15: 150 ug via INTRAVENOUS
  Administered 2016-01-15: 100 ug via INTRAVENOUS
  Administered 2016-01-15 (×2): 50 ug via INTRAVENOUS
  Administered 2016-01-15: 150 ug via INTRAVENOUS

## 2016-01-15 MED ORDER — ACETAMINOPHEN 325 MG PO TABS: 650.0000 mg | ORAL_TABLET | Freq: Four times a day (QID) | ORAL | Status: DC | PRN

## 2016-01-15 MED ORDER — POTASSIUM CHLORIDE IN NACL 20-0.45 MEQ/L-% IV SOLN
INTRAVENOUS | Status: DC
  Administered 2016-01-15: 17:00:00 via INTRAVENOUS
  Filled 2016-01-15 (×3): qty 1000

## 2016-01-15 MED ORDER — ASPIRIN EC 325 MG PO TBEC: 325.0000 mg | DELAYED_RELEASE_TABLET | Freq: Every day | ORAL | Status: DC

## 2016-01-15 MED ORDER — SUCCINYLCHOLINE CHLORIDE 20 MG/ML IJ SOLN
INTRAMUSCULAR | Status: AC
  Filled 2016-01-15: qty 1

## 2016-01-15 MED ORDER — PHENYLEPHRINE 40 MCG/ML (10ML) SYRINGE FOR IV PUSH (FOR BLOOD PRESSURE SUPPORT)
PREFILLED_SYRINGE | INTRAVENOUS | Status: AC
  Filled 2016-01-15: qty 10

## 2016-01-15 MED ORDER — FENTANYL CITRATE (PF) 100 MCG/2ML IJ SOLN
50.0000 ug | Freq: Once | INTRAMUSCULAR | Status: AC
  Administered 2016-01-15: 50 ug via INTRAVENOUS

## 2016-01-15 MED ORDER — METHOCARBAMOL 1000 MG/10ML IJ SOLN
500.0000 mg | Freq: Four times a day (QID) | INTRAVENOUS | Status: DC | PRN
  Filled 2016-01-15: qty 5

## 2016-01-15 MED ORDER — METHOCARBAMOL 500 MG PO TABS
500.0000 mg | ORAL_TABLET | Freq: Four times a day (QID) | ORAL | Status: DC | PRN
  Administered 2016-01-15 – 2016-01-16 (×2): 500 mg via ORAL
  Filled 2016-01-15 (×3): qty 1

## 2016-01-15 SURGICAL SUPPLY — 60 items
BANDAGE ACE 4X5 VEL STRL LF (GAUZE/BANDAGES/DRESSINGS) ×2 IMPLANT
BANDAGE ACE 6X5 VEL STRL LF (GAUZE/BANDAGES/DRESSINGS) ×2 IMPLANT
BANDAGE ELASTIC 4 VELCRO ST LF (GAUZE/BANDAGES/DRESSINGS) IMPLANT
BANDAGE ELASTIC 6 VELCRO ST LF (GAUZE/BANDAGES/DRESSINGS) IMPLANT
BANDAGE ESMARK 6X9 LF (GAUZE/BANDAGES/DRESSINGS) IMPLANT
BIT DRILL CANN LRG QC 5X300 (BIT) ×2 IMPLANT
BNDG ESMARK 6X9 LF (GAUZE/BANDAGES/DRESSINGS)
COVER MAYO STAND STRL (DRAPES) ×2 IMPLANT
COVER SURGICAL LIGHT HANDLE (MISCELLANEOUS) ×2 IMPLANT
CUFF TOURNIQUET SINGLE 34IN LL (TOURNIQUET CUFF) ×2 IMPLANT
DECANTER SPIKE VIAL GLASS SM (MISCELLANEOUS) ×4 IMPLANT
DRAPE C-ARM 42X72 X-RAY (DRAPES) IMPLANT
DRAPE C-ARMOR (DRAPES) ×2 IMPLANT
DRAPE INCISE IOBAN 66X45 STRL (DRAPES) ×2 IMPLANT
DRAPE PROXIMA HALF (DRAPES) ×2 IMPLANT
DRAPE U-SHAPE 47X51 STRL (DRAPES) ×2 IMPLANT
DRSG PAD ABDOMINAL 8X10 ST (GAUZE/BANDAGES/DRESSINGS) ×2 IMPLANT
DURAPREP 26ML APPLICATOR (WOUND CARE) ×2 IMPLANT
ELECT REM PT RETURN 9FT ADLT (ELECTROSURGICAL) ×2
ELECTRODE REM PT RTRN 9FT ADLT (ELECTROSURGICAL) ×1 IMPLANT
GAUZE SPONGE 4X4 12PLY STRL (GAUZE/BANDAGES/DRESSINGS) ×2 IMPLANT
GAUZE XEROFORM 1X8 LF (GAUZE/BANDAGES/DRESSINGS) ×2 IMPLANT
GAUZE XEROFORM 5X9 LF (GAUZE/BANDAGES/DRESSINGS) ×2 IMPLANT
GLOVE BIOGEL PI IND STRL 7.0 (GLOVE) ×1 IMPLANT
GLOVE BIOGEL PI IND STRL 8 (GLOVE) ×2 IMPLANT
GLOVE BIOGEL PI INDICATOR 7.0 (GLOVE) ×1
GLOVE BIOGEL PI INDICATOR 8 (GLOVE) ×2
GLOVE ORTHO TXT STRL SZ7.5 (GLOVE) ×4 IMPLANT
GLOVE SURG SS PI 6.0 STRL IVOR (GLOVE) ×2 IMPLANT
GLOVE SURG SS PI 6.5 STRL IVOR (GLOVE) ×2 IMPLANT
GOWN STRL REUS W/ TWL LRG LVL3 (GOWN DISPOSABLE) ×1 IMPLANT
GOWN STRL REUS W/ TWL XL LVL3 (GOWN DISPOSABLE) ×1 IMPLANT
GOWN STRL REUS W/TWL 2XL LVL3 (GOWN DISPOSABLE) ×2 IMPLANT
GOWN STRL REUS W/TWL LRG LVL3 (GOWN DISPOSABLE) ×1
GOWN STRL REUS W/TWL XL LVL3 (GOWN DISPOSABLE) ×1
GUIDEWIRE THREADED 2.8 (WIRE) ×4 IMPLANT
KIT BASIN OR (CUSTOM PROCEDURE TRAY) ×2 IMPLANT
KIT ROOM TURNOVER OR (KITS) ×2 IMPLANT
MANIFOLD NEPTUNE II (INSTRUMENTS) ×2 IMPLANT
NEEDLE HYPO 25X1 1.5 SAFETY (NEEDLE) ×2 IMPLANT
NS IRRIG 1000ML POUR BTL (IV SOLUTION) ×2 IMPLANT
PACK ORTHO EXTREMITY (CUSTOM PROCEDURE TRAY) ×2 IMPLANT
PAD ARMBOARD 7.5X6 YLW CONV (MISCELLANEOUS) ×4 IMPLANT
PAD CAST 4YDX4 CTTN HI CHSV (CAST SUPPLIES) ×1 IMPLANT
PADDING CAST COTTON 4X4 STRL (CAST SUPPLIES) ×1
PADDING CAST COTTON 6X4 STRL (CAST SUPPLIES) ×2 IMPLANT
SCREW CANN 45MM (Screw) ×2 IMPLANT
SCREW CANN TI 16 THRD 6.5X55 (Screw) ×2 IMPLANT
SPLINT FIBERGLASS 4X30 (CAST SUPPLIES) ×2 IMPLANT
SPONGE GAUZE 4X4 12PLY STER LF (GAUZE/BANDAGES/DRESSINGS) ×2 IMPLANT
SPONGE LAP 18X18 X RAY DECT (DISPOSABLE) ×4 IMPLANT
SUCTION FRAZIER TIP 10 FR DISP (SUCTIONS) ×2 IMPLANT
SUT ETHILON 3 0 PS 1 (SUTURE) ×4 IMPLANT
SYR CONTROL 10ML LL (SYRINGE) ×2 IMPLANT
TOWEL OR 17X24 6PK STRL BLUE (TOWEL DISPOSABLE) ×2 IMPLANT
TOWEL OR 17X26 10 PK STRL BLUE (TOWEL DISPOSABLE) ×2 IMPLANT
TUBE CONNECTING 12X1/4 (SUCTIONS) ×2 IMPLANT
WASHER FOR 5.0 SCREWS (Washer) ×4 IMPLANT
WATER STERILE IRR 1000ML POUR (IV SOLUTION) ×2 IMPLANT
YANKAUER SUCT BULB TIP NO VENT (SUCTIONS) ×2 IMPLANT

## 2016-01-15 NOTE — Evaluation (Signed)
Physical Therapy Evaluation Patient Details Name: [name] MRN: [id number] DOB: May 20, 1956 Today's Date: 01/15/2016   History of Present Illness  60 y.o. female s/p ORIF calcaneus fracture, left.  Clinical Impression  Patient is s/p above procedure, presenting with functional limitations due to the deficits listed below (see PT Problem List). Demonstrates ability to safely ambulate while maintaining NWB on LLE at all times. Educated on LE elevation and precautions with mobility. Plan to navigate steps tomorrow prior to d/c. Pt prefers RW rather than crutches and has both devices at home. Has assistance as needed at home.    Follow Up Recommendations No PT follow up    Equipment Recommendations  None recommended by PT    Recommendations for Other Services       Precautions / Restrictions Precautions Precautions: Fall Restrictions Weight Bearing Restrictions: Yes LLE Weight Bearing: Non weight bearing      Mobility  Bed Mobility Overal bed mobility: Modified Independent             General bed mobility comments: extra time  Transfers Overall transfer level: Needs assistance Equipment used: Rolling walker (2 wheeled) Transfers: Sit to/from Stand Sit to Stand: Supervision         General transfer comment: supervision for safety. Good stability once upright with RW for support. VC for hand placement and to maintain NWB on LLE at all times.  Ambulation/Gait Ambulation/Gait assistance: Supervision Ambulation Distance (Feet): 80 Feet Assistive device: Rolling walker (2 wheeled) Gait Pattern/deviations:  ("hop-to" pattern) Gait velocity: decreased Gait velocity interpretation: Below normal speed for age/gender General Gait Details: Educated on safe DME use with rolling walker. Good control, VC for safety. Maintains NWB on LLE at all times. No physical assist required.  Stairs            Wheelchair Mobility    Modified Rankin (Stroke Patients Only)        Balance Overall balance assessment: Needs assistance Sitting-balance support: No upper extremity supported;Feet supported Sitting balance-Leahy Scale: Normal     Standing balance support: Single extremity supported Standing balance-Leahy Scale: Poor                               Pertinent Vitals/Pain Pain Assessment: 0-10 Pain Score: 3  Pain Location: Lt ankle Pain Descriptors / Indicators: Throbbing Pain Intervention(s): Monitored during session;Repositioned    Home Living Family/patient expects to be discharged to:: Private residence Living Arrangements: Spouse/significant other Available Help at Discharge: Family;Available 24 hours/day Type of Home: House Home Access: Stairs to enter Entrance Stairs-Rails: Left Entrance Stairs-Number of Steps: 4 Home Layout: One level Home Equipment: Walker - 2 wheels;Tub bench;Bedside commode      Prior Function Level of Independence: Independent               Hand Dominance   Dominant Hand: Right    Extremity/Trunk Assessment   Upper Extremity Assessment: Defer to OT evaluation           Lower Extremity Assessment: LLE deficits/detail   LLE Deficits / Details: able to move toes, reports numbness     Communication   Communication: No difficulties  Cognition Arousal/Alertness: Awake/alert Behavior During Therapy: Anxious Overall Cognitive Status: Within Functional Limits for tasks assessed                      General Comments General comments (skin integrity, edema, etc.): Educated on elevation. husband present and  supportive    Exercises General Exercises - Lower Extremity Gluteal Sets: Strengthening;Both;10 reps;Seated Long Arc Quad: Strengthening;Left;10 reps;Seated Hip Flexion/Marching: Strengthening;Both;10 reps;Seated      Assessment/Plan    PT Assessment Patient needs continued PT services  PT Diagnosis Abnormality of gait;Acute pain   PT Problem List Decreased  strength;Decreased range of motion;Decreased activity tolerance;Decreased balance;Decreased mobility;Decreased knowledge of use of DME;Impaired sensation;Pain  PT Treatment Interventions DME instruction;Gait training;Stair training;Functional mobility training;Therapeutic exercise;Therapeutic activities;Balance training;Neuromuscular re-education;Patient/family education   PT Goals (Current goals can be found in the Care Plan section) Acute Rehab PT Goals Patient Stated Goal: No pain PT Goal Formulation: With patient Time For Goal Achievement: 01/22/16 Potential to Achieve Goals: Good    Frequency Min 5X/week   Barriers to discharge        Co-evaluation               End of Session   Activity Tolerance: Patient tolerated treatment well Patient left: in bed;with call bell/phone within reach;with family/visitor present (LE elevated) Nurse Communication: Mobility status;Weight bearing status         Time: [id number] PT Time Calculation (min) (ACUTE ONLY): 26 min   Charges:   PT Evaluation $PT Eval Low Complexity: 1 Procedure PT Treatments $Gait Training: 8-22 mins   PT G CodesEllouise Newer 01/15/2016, 6:26 PM Camille Bal [place], [place]

## 2016-01-15 NOTE — Transfer of Care (Signed)
Immediate Anesthesia Transfer of Care Note  Patient: [name]  Procedure(s) Performed: Procedure(s): OPEN REDUCTION INTERNAL FIXATION (ORIF) LEFT CALCANEOUS FRACTURE (Left)  Patient Location: PACU  Anesthesia Type:General  Level of Consciousness: awake, alert , oriented, patient cooperative and responds to stimulation  Airway & Oxygen Therapy: Patient Spontanous Breathing  Post-op Assessment: Report given to RN, Post -op Vital signs reviewed and stable, Patient moving all extremities and Patient moving all extremities X 4  Post vital signs: Reviewed and stable  Last Vitals:  Filed Vitals:   01/15/16 0915 01/15/16 1251  BP: 148/61   Pulse: 79 104  Temp: 37.1 C 36.4 C  Resp: 18 14    Complications: No apparent anesthesia complications

## 2016-01-15 NOTE — Brief Op Note (Signed)
01/15/2016  12:35 PM  PATIENT:  [name]  60 y.o. female  PRE-OPERATIVE DIAGNOSIS:  Left Calcaneus Fracture  POST-OPERATIVE DIAGNOSIS:  Left Calcaneus fracture  PROCEDURE:  Procedure(s): OPEN REDUCTION INTERNAL FIXATION (ORIF) LEFT CALCANEOUS FRACTURE (Left)  SURGEON:  Surgeon(s) and Role:    * Marybelle Killings, MD - Primary  PHYSICIAN ASSISTANT: Benjiman Core pa-c    ANESTHESIA:   general  EBL:  Total I/O In: 1000 [I.V.:1000] Out: -   BLOOD ADMINISTERED:none  DRAINS: none   LOCAL MEDICATIONS USED:  MARCAINE     SPECIMEN:  No Specimen  DISPOSITION OF SPECIMEN:  N/A  COUNTS:  YES  TOURNIQUET:   Total Tourniquet Time Documented: Calf (Left) - 29 minutes Total: Calf (Left) - 29 minutes   DICTATION: .Viviann Spare Dictation  PLAN OF CARE: Admit for overnight observation  PATIENT DISPOSITION:  PACU - hemodynamically stable.

## 2016-01-15 NOTE — Anesthesia Procedure Notes (Signed)
Procedure Name: Intubation Date/Time: 01/15/2016 11:09 AM Performed by: Jacquiline Doe A Pre-anesthesia Checklist: Patient identified, Emergency Drugs available, Suction available, Patient being monitored and Timeout performed Patient Re-evaluated:Patient Re-evaluated prior to inductionOxygen Delivery Method: Circle system utilized Preoxygenation: Pre-oxygenation with 100% oxygen Intubation Type: IV induction and Cricoid Pressure applied Ventilation: Mask ventilation without difficulty Laryngoscope Size: Mac and 3 Grade View: Grade I Tube type: Oral Tube size: 7.5 mm Number of attempts: 1 Airway Equipment and Method: Stylet Placement Confirmation: ETT inserted through vocal cords under direct vision,  positive ETCO2 and breath sounds checked- equal and bilateral Secured at: 21 cm Tube secured with: Tape Dental Injury: Teeth and Oropharynx as per pre-operative assessment

## 2016-01-15 NOTE — Interval H&P Note (Signed)
History and Physical Interval Note:  01/15/2016 9:37 AM  [name]  has presented today for surgery, with the diagnosis of Left Calcaneus Fracture  The various methods of treatment have been discussed with the patient and family. After consideration of risks, benefits and other options for treatment, the patient has consented to  Procedure(s): OPEN REDUCTION INTERNAL FIXATION (ORIF) LEFT CALCANEOUS FRACTURE (Left) as a surgical intervention .  The patient's history has been reviewed, patient examined, no change in status, stable for surgery.  I have reviewed the patient's chart and labs.  Questions were answered to the patient's satisfaction.     Arriel Victor C

## 2016-01-15 NOTE — Op Note (Signed)
[name], [name] NO.:  [id number]  MEDICAL RECORD NO.:  [id number]  LOCATION:  [facility]                         FACILITY:  [facility]  PHYSICIAN:  Quanisha Drewry C. Lorin Mercy, M.D.    DATE OF BIRTH:  October 22, 1956  DATE OF PROCEDURE:  01/15/2016 DATE OF DISCHARGE:                              OPERATIVE REPORT   PREOPERATIVE DIAGNOSIS:  Displaced calcaneus fracture (calcaneal tuberosity avulsion).  POSTOPERATIVE DIAGNOSIS:  Displaced calcaneus fracture (calcaneal tuberosity avulsion).  PROCEDURE:  ORIF calcaneus fracture, left.  SURGEON:  Vici Novick C. Lorin Mercy, M.D.  ASSISTANT:  Alyson Locket. Ricard Dillon, PA-C, medically necessary and present for the entire procedure.  EBL:  Minimal.  TOURNIQUET TIME:  Less than an hour.  BRIEF HISTORY:  This 60 year old female was backing down the stairs, pulling a TV along the step and thought that she was all the way down. When turns out, there was another step behind her, she stepped backwards, TV came forward, and she suffered a calcaneus avulsion fracture as well as a Jones fracture, nondisplaced, fifth metatarsal.  X-rays showed tuberosity avulsion with displacement of the tuberosity, with the Achilles visualized on plain radiograph, displaced with displacement of 3.7 cm.  The patient did not have plantar flexion.  She was placed in the Jones dressing careful padding plantar flex position and now returns for operative fixation.  DESCRIPTION OF PROCEDURE:  After induction of general anesthesia, the patient was placed prone on chest rolls.  Proximal thigh tourniquet was applied, before she was flipped over.  10-15 drape applied at the knee. Splint was removed.  There were some blisters over the Achilles tendon, slightly more on the medial aspect from the calcaneal fragment, causing some prominence of the skin.  Skin marker was used for incision along the lateral aspect of the Achilles tendon and extended down to the junction between the lateral  portion of the foot and the plantar portion of the foot.  Cross hatches were made.  General wrapping with an Esmarch.  Tourniquet inflation of 350, which was a thigh tourniquet.  A time-out procedure was completed.  Ancef was given prophylactically. Incision was made.  The fragment was identified and there was significant hematoma at the fracture site with clots of blood that had to be irrigated and suctioned out.  Once all the clots were removed, the sural nerve was gently protected.  The tuberosity fragment was grasped with a towel clip.  Flexion of the knee and plantar flexion of the foot aided in reduction of the fragment.  It was held with a 0.62 K-wire. Checked under fluoroscopy with good reduction.  65 titanium Synthes cannulated screws were used and the initial screw was placed, between the middle portion of the Achilles tendon and the medial aspect of the Achilles tendon with a small split made in the tendon, large enough for the washer to be slipped through, over the K-wire.  A 55 and 45 screw were placed.  The second screw was placed along the lateral aspect of the calcaneus.  Both screws were more fully threaded, cancellous lag screws lagged the fracture, caught good cancellous bone, did not penetrate the cortical surface.  There was some convergence and  both were inside the bone.  Ankle and foot were checked with range of motion. The fragment was stable and with these 2 screws with 2 washers, there was reduction instability.  Spot pictures were taken under fluoroscopy with confirmation and then tourniquet was deflated.  Nylon skin closure with care taken to avoid catching the sural nerve with a suture. Marcaine was infiltrated.  A total of 10 mL fracture hematoma as well as around the sural nerve.  A piece of Xeroform was placed over the fracture blister.  Xeroform over the incision.  4x4s, ABD, and Webril. The Jones fracture remained in good position.  It was checked  under fluoroscopy, prior to application of the splint.  Short-leg splint with a 4-inch fiberglass splint, leaving the foot in plantar flexed position, with extra thick Webril.  The patient tolerated the procedure well. Transferred to the recovery in stable condition and will stay overnight for a pain medication.     Shadow Stiggers C. Lorin Mercy, M.D.     MCY/MEDQ  D:  01/15/2016  T:  01/15/2016  Job:  [id number]

## 2016-01-15 NOTE — Interval H&P Note (Signed)
History and Physical Interval Note:  01/15/2016 9:37 AM  [name]  has presented today for surgery, with the diagnosis of Left Calcaneus Fracture  The various methods of treatment have been discussed with the patient and family. After consideration of risks, benefits and other options for treatment, the patient has consented to  Procedure(s): OPEN REDUCTION INTERNAL FIXATION (ORIF) LEFT CALCANEOUS FRACTURE (Left) as a surgical intervention .  The patient's history has been reviewed, patient examined, no change in status, stable for surgery.  I have reviewed the patient's chart and labs.  Questions were answered to the patient's satisfaction.     YATES,MARK C

## 2016-01-15 NOTE — Anesthesia Preprocedure Evaluation (Signed)
Anesthesia Evaluation  Patient identified by MRN, date of birth, ID band Patient awake    Reviewed: Allergy & Precautions, NPO status , Patient's Chart, lab work & pertinent test results  Airway Mallampati: I  TM Distance: >3 FB     Dental   Pulmonary    Pulmonary exam normal        Cardiovascular hypertension, Normal cardiovascular exam     Neuro/Psych    GI/Hepatic GERD  ,  Endo/Other    Renal/GU      Musculoskeletal   Abdominal   Peds  Hematology   Anesthesia Other Findings   Reproductive/Obstetrics                             Anesthesia Physical Anesthesia Plan  ASA: I  Anesthesia Plan: General   Post-op Pain Management:    Induction: Intravenous  Airway Management Planned: Oral ETT  Additional Equipment:   Intra-op Plan:   Post-operative Plan: Extubation in OR  Informed Consent: I have reviewed the patients History and Physical, chart, labs and discussed the procedure including the risks, benefits and alternatives for the proposed anesthesia with the patient or authorized representative who has indicated his/her understanding and acceptance.     Plan Discussed with: CRNA, Anesthesiologist and Surgeon  Anesthesia Plan Comments:         Anesthesia Quick Evaluation

## 2016-01-15 NOTE — Anesthesia Postprocedure Evaluation (Signed)
Anesthesia Post Note  Patient: [name]  Procedure(s) Performed: Procedure(s) (LRB): OPEN REDUCTION INTERNAL FIXATION (ORIF) LEFT CALCANEOUS FRACTURE (Left)  Patient location during evaluation: PACU Anesthesia Type: General Level of consciousness: awake, awake and alert, oriented and patient cooperative Pain management: pain level controlled Vital Signs Assessment: post-procedure vital signs reviewed and stable Respiratory status: spontaneous breathing and respiratory function stable Cardiovascular status: blood pressure returned to baseline and stable Anesthetic complications: no    Last Vitals:  Filed Vitals:   01/15/16 0915 01/15/16 1251  BP: 148/61   Pulse: 79 104  Temp: 37.1 C 36.4 C  Resp: 18 14    Last Pain:  Filed Vitals:   01/15/16 1251  PainSc: 2                  Glennette Galster EDWARD

## 2016-01-16 DIAGNOSIS — S92002A Unspecified fracture of left calcaneus, initial encounter for closed fracture: Secondary | ICD-10-CM | POA: Diagnosis not present

## 2016-01-16 NOTE — Progress Notes (Signed)
Physical Therapy Treatment Patient Details Name: [name] MRN: [id number] DOB: February 12, 1956 Today's Date: 01/16/2016    History of Present Illness 60 y.o. female s/p ORIF calcaneus fracture, left.    PT Comments    Patient is making good progress with PT and able to ascend/descend stairs safely while maintaining NWB status throughout session.  From a mobility standpoint anticipate patient will be ready for DC home when medically ready.     Follow Up Recommendations  No PT follow up     Equipment Recommendations  None recommended by PT    Recommendations for Other Services       Precautions / Restrictions Precautions Precautions: Fall Restrictions Weight Bearing Restrictions: Yes LLE Weight Bearing: Non weight bearing    Mobility  Bed Mobility Overal bed mobility: Modified Independent             General bed mobility comments: extra time  Transfers Overall transfer level: Needs assistance Equipment used: Rolling walker (2 wheeled) Transfers: Sit to/from Stand Sit to Stand: Supervision         General transfer comment: vc for safe hand placement and sequencing; supervision for safety  Ambulation/Gait Ambulation/Gait assistance: Supervision Ambulation Distance (Feet): 150 Feet (one seated rest break) Assistive device: Rolling walker (2 wheeled) Gait Pattern/deviations: Step-to pattern Gait velocity: decreased Gait velocity interpretation: Below normal speed for age/gender General Gait Details: pt with carry over of technique with safe use of RW and ability to maintain NWB throughtou   Stairs Stairs: Yes Stairs assistance: Min guard Stair Management: One rail Left;With crutches;Forwards Number of Stairs: 4 (2X2) General stair comments: pt educated on use of crutch and L hand rail to ascen/descend steps; min guard for safety; pt with no LOB and with good safety awareness  Wheelchair Mobility    Modified Rankin (Stroke Patients Only)        Balance Overall balance assessment: Needs assistance Sitting-balance support: No upper extremity supported;Feet supported Sitting balance-Leahy Scale: Normal     Standing balance support: Single extremity supported Standing balance-Leahy Scale: Fair                      Cognition Arousal/Alertness: Awake/alert Behavior During Therapy: Anxious Overall Cognitive Status: Within Functional Limits for tasks assessed                      Exercises      General Comments General comments (skin integrity, edema, etc.): reviewed HEP with pt       Pertinent Vitals/Pain Pain Assessment: Faces Faces Pain Scale: Hurts a little bit Pain Location: L LE Pain Descriptors / Indicators: Pressure;Throbbing Pain Intervention(s): Limited activity within patient's tolerance;Monitored during session;Premedicated before session;Repositioned;Ice applied    Home Living                      Prior Function            PT Goals (current goals can now be found in the care plan section) Acute Rehab PT Goals Patient Stated Goal: go home PT Goal Formulation: With patient Time For Goal Achievement: 01/22/16 Potential to Achieve Goals: Good Progress towards PT goals: Progressing toward goals    Frequency  Min 5X/week    PT Plan Current plan remains appropriate    Co-evaluation             End of Session Equipment Utilized During Treatment: Gait belt Activity Tolerance: Patient tolerated treatment well Patient left: with  call bell/phone within reach;in chair (LE elevated)     Time: 1010-1050 PT Time Calculation (min) (ACUTE ONLY): 40 min  Charges:  $Gait Training: 23-37 mins $Therapeutic Activity: 8-22 mins                    G Codes:      [name], PTA Pager: [phone]   01/16/2016, 11:01 AM

## 2016-01-16 NOTE — Discharge Summary (Signed)
Physician Discharge Summary  Patient ID: [name] MRN: [id number] DOB/AGE: 09/01/1956 60 y.o.  Admit date: 01/15/2016 Discharge date: 01/16/2016  Admission Diagnoses: Left calcaneus fracture  Discharge Diagnoses:  Active Problems:   Calcaneal fracture   Discharged Condition: stable  Hospital Course: Patient's hospital course was essentially unremarkable. She underwent open reduction until fixation for the calcaneus fracture postoperatively she progressed well and was discharged to home in stable condition.  Consults: None  Significant Diagnostic Studies: labs: Routine labs  Treatments: surgery: See operative note  Discharge Exam: Blood pressure 126/57, pulse 95, temperature 99.3 F (37.4 C), temperature source Oral, resp. rate 17, height 5\' 5"  (1.651 m), weight 64.921 kg (143 lb 2 oz), SpO2 95 %. Incision/Wound: dressing clean and dry  Disposition: 01-Home or Self Care  Discharge Instructions    Call MD / Call 911    Complete by:  As directed   If you experience chest pain or shortness of breath, CALL 911 and be transported to the hospital emergency room.  If you develope a fever above 101 F, pus (white drainage) or increased drainage or redness at the wound, or calf pain, call your surgeon's office.     Constipation Prevention    Complete by:  As directed   Drink plenty of fluids.  Prune juice may be helpful.  You may use a stool softener, such as Colace (over the counter) 100 mg twice a day.  Use MiraLax (over the counter) for constipation as needed.     Diet - low sodium heart healthy    Complete by:  As directed      Discharge instructions    Complete by:  As directed   Do not remove splint/dressing or get wet.  Strict non-weightbearing left leg. Use crutches or walker.   Elevate foot above heart level as much as possible to decrease swelling and pain. Ice of and on prn.     Driving restrictions    Complete by:  As directed   No driving until further notice.     Increase activity slowly as tolerated    Complete by:  As directed      Lifting restrictions    Complete by:  As directed   No lifting.            Medication List    STOP taking these medications        acetaminophen 325 MG tablet  Commonly known as:  TYLENOL      TAKE these medications        ALPRAZolam 0.25 MG tablet  Commonly known as:  XANAX  Take 1 tablet (0.25 mg total) by mouth as needed for anxiety or sleep.     aspirin EC 325 MG tablet  Take 1 tablet (325 mg total) by mouth daily.     cholecalciferol 1000 units tablet  Commonly known as:  VITAMIN D  Take 1,000 Units by mouth daily.     denosumab 60 MG/ML Soln injection  Commonly known as:  PROLIA  Inject 60 mg into the skin every 6 (six) months. Administer in upper arm, thigh, or abdomen     diphenhydrAMINE 25 MG tablet  Commonly known as:  BENADRYL  Take 25 mg by mouth every 6 (six) hours as needed for itching or sleep (Takes half a tablet when needed for itching or sleep).     lisinopril 5 MG tablet  Commonly known as:  PRINIVIL,ZESTRIL  Take 5 mg by mouth daily.  Melatonin 5 MG Tabs  Take 5 mg by mouth at bedtime.     multivitamin tablet  Take 1 tablet by mouth daily.     oxyCODONE-acetaminophen 5-325 MG tablet  Commonly known as:  PERCOCET  Take 1-2 tablets by mouth every 4 (four) hours as needed.     VAGIFEM 10 MCG Tabs vaginal tablet  Generic drug:  Estradiol  INSERT ONE INTO VAGINA FIVE TIMES WEEKLY     vitamin C 500 MG tablet  Commonly known as:  ASCORBIC ACID  Take 500 mg by mouth daily.           Follow-up Information    Follow up with YATES,MARK C, MD In 1 week.   Specialty:  Orthopedic Surgery   Contact information:   [place] [address] [phone]       Signed: Newt Minion 01/16/2016, 8:32 AM

## 2016-01-18 ENCOUNTER — Encounter (HOSPITAL_COMMUNITY): Payer: Self-pay | Admitting: Orthopaedic Surgery

## 2016-01-20 NOTE — Progress Notes (Signed)
Physical Therapy Note  Addendum for missed Gcode   02/13/16 1700  PT G-Codes **NOT FOR INPATIENT CLASS**  Functional Assessment Tool Used Clinical Observation  Functional Limitation Mobility: Walking and moving around  Mobility: Walking and Moving Around Current Status [phone]) CI  Mobility: Walking and Moving Around Goal Status [phone]) CI  Camille Bal [place], [place]

## 2016-02-10 ENCOUNTER — Encounter: Payer: BC Managed Care – PPO | Admitting: Gynecology

## 2016-03-02 ENCOUNTER — Encounter: Payer: BC Managed Care – PPO | Admitting: Gynecology

## 2016-03-10 ENCOUNTER — Encounter: Payer: Self-pay | Admitting: Women's Health

## 2016-03-10 ENCOUNTER — Ambulatory Visit (INDEPENDENT_AMBULATORY_CARE_PROVIDER_SITE_OTHER): Payer: BC Managed Care – PPO | Admitting: Women's Health

## 2016-03-10 VITALS — BP 132/80 | Ht 65.0 in | Wt 144.0 lb

## 2016-03-10 DIAGNOSIS — Z01419 Encounter for gynecological examination (general) (routine) without abnormal findings: Secondary | ICD-10-CM

## 2016-03-10 DIAGNOSIS — N952 Postmenopausal atrophic vaginitis: Secondary | ICD-10-CM

## 2016-03-10 DIAGNOSIS — F4322 Adjustment disorder with anxiety: Secondary | ICD-10-CM | POA: Diagnosis not present

## 2016-03-10 MED ORDER — ESTRADIOL 2 MG VA RING
2.0000 mg | VAGINAL_RING | VAGINAL | Status: DC
Start: 2016-03-10 — End: 2016-11-23

## 2016-03-10 MED ORDER — ALPRAZOLAM 0.25 MG PO TABS: 0.2500 mg | ORAL_TABLET | ORAL | Status: DC | PRN

## 2016-03-10 NOTE — Progress Notes (Signed)
[name] 11-Jul-1956 [id number]    History:    Presents for annual exam. Postmenopausal/no HRT/no bleeding, reports some vaginal dryness. Normal Pap and mammogram history. History of osteoporosis on Prolia injections, started 11/2011. Bone density scan in 2016 T score -2.6 had been -2.8 in 201. 2014 colonoscopy with benign polyps, on 5 year recall. Broke left calcaneus in January from traumatic fall downstairs, currently in a walking boot had been casted. CBC, CMP, lipids on 09/22/2015 normal- calcium 9.4, HDL 79. 2014 vitamin D level 50.  Prolia injection dates: December 2012 June 2013 December 2013 July 2014 Did not receive- January 2015 September 2015 April 2016 October 2016  Past medical history, past surgical history, family history and social history were all reviewed and documented in the EPIC chart. Retired Public relations account executive in business. 1 daughter, Joellen [place], and a 25-month-old grandson.  ROS:  A ROS was performed and pertinent positives and negatives are included.  Exam:  Filed Vitals:   03/10/16 1434  BP: 132/80    General appearance:  Normal Thyroid:  Symmetrical, normal in size, without palpable masses or nodularity. Respiratory  Auscultation:  Clear without wheezing or rhonchi Cardiovascular  Auscultation:  Regular rate, without rubs, murmurs or gallops  Edema/varicosities:  Not grossly evident Abdominal  Soft,nontender, without masses, guarding or rebound.  Liver/spleen:  No organomegaly noted  Hernia:  None appreciated  Skin  Inspection:  Grossly normal   Breasts: Examined lying and sitting.     Right: Without masses, retractions, discharge or axillary adenopathy.     Left: Without masses, retractions, discharge or axillary adenopathy. Gentitourinary   Inguinal/mons:  Normal without inguinal adenopathy  External genitalia:  Normal  BUS/Urethra/Skene's glands:  Normal  Vagina:  Normal  Cervix:  Normal  Uterus:  Normal in size, shape and contour.   Midline and mobile  Adnexa/parametria:     Rt: Without masses or tenderness.   Lt: Without masses or tenderness.  Anus and perineum: Normal  Digital rectal exam: Normal sphincter tone without palpated masses or tenderness  Assessment/Plan:  60 y.o. MWF G1 P1 for annual exam with complaint of vaginal dryness.  Postmenopausal/no HRT/ no bleeding Vaginal dryness/vaginal atrophy Osteoporosis on Prolia- start 11/2011 Hypertension-meds and Labs managed at primary care  Plan: SBE's, continue annual 3-D screening mammogram, return to daily walking once able, calcium rich diet, vitamin D 1000 daily encouraged. And safety reviewed, importance of weightbearing exercise. Discussed Vagifem vs Estring for relief of vaginal dryness. Gave information on Estring with instructions on use and insertion. Has used Vagifem in the past reports expensive.. Instructed to call if continued dyspareunia with dryness. Rx for Xanax 0.25 mg given for mild anxiety, addictive properties reviewed aware to use sparingly has only used one prescription in one year.. 2015 normal Pap with negative HR HPV, new guidelines reviewed. Recommended shingles vaccine at 60.    [facility], 3:25 PM 03/10/2016

## 2016-03-10 NOTE — Patient Instructions (Signed)
Health Maintenance, Female Adopting a healthy lifestyle and getting preventive care can go a long way to promote health and wellness. Talk with your health care provider about what schedule of regular examinations is right for you. This is a good chance for you to check in with your provider about disease prevention and staying healthy. In between checkups, there are plenty of things you can do on your own. Experts have done a lot of research about which lifestyle changes and preventive measures are most likely to keep you healthy. Ask your health care provider for more information. WEIGHT AND DIET  Eat a healthy diet  Be sure to include plenty of vegetables, fruits, low-fat dairy products, and lean protein.  Do not eat a lot of foods high in solid fats, added sugars, or salt.  Get regular exercise. This is one of the most important things you can do for your health.  Most adults should exercise for at least 150 minutes each week. The exercise should increase your heart rate and make you sweat (moderate-intensity exercise).  Most adults should also do strengthening exercises at least twice a week. This is in addition to the moderate-intensity exercise.  Maintain a healthy weight  Body mass index (BMI) is a measurement that can be used to identify possible weight problems. It estimates body fat based on height and weight. Your health care provider can help determine your BMI and help you achieve or maintain a healthy weight.  For females 20 years of age and older:   A BMI below 18.5 is considered underweight.  A BMI of 18.5 to 24.9 is normal.  A BMI of 25 to 29.9 is considered overweight.  A BMI of 30 and above is considered obese.  Watch levels of cholesterol and blood lipids  You should start having your blood tested for lipids and cholesterol at 60 years of age, then have this test every 5 years.  You may need to have your cholesterol levels checked more often if:  Your lipid  or cholesterol levels are high.  You are older than 60 years of age.  You are at high risk for heart disease.  CANCER SCREENING   Lung Cancer  Lung cancer screening is recommended for adults 55-80 years old who are at high risk for lung cancer because of a history of smoking.  A yearly low-dose CT scan of the lungs is recommended for people who:  Currently smoke.  Have quit within the past 15 years.  Have at least a 30-pack-year history of smoking. A pack year is smoking an average of one pack of cigarettes a day for 1 year.  Yearly screening should continue until it has been 15 years since you quit.  Yearly screening should stop if you develop a health problem that would prevent you from having lung cancer treatment.  Breast Cancer  Practice breast self-awareness. This means understanding how your breasts normally appear and feel.  It also means doing regular breast self-exams. Let your health care provider know about any changes, no matter how small.  If you are in your 20s or 30s, you should have a clinical breast exam (CBE) by a health care provider every 1-3 years as part of a regular health exam.  If you are 40 or older, have a CBE every year. Also consider having a breast X-ray (mammogram) every year.  If you have a family history of breast cancer, talk to your health care provider about genetic screening.  If you   are at high risk for breast cancer, talk to your health care provider about having an MRI and a mammogram every year.  Breast cancer gene (BRCA) assessment is recommended for women who have family members with BRCA-related cancers. BRCA-related cancers include:  Breast.  Ovarian.  Tubal.  Peritoneal cancers.  Results of the assessment will determine the need for genetic counseling and BRCA1 and BRCA2 testing. Cervical Cancer Your health care provider may recommend that you be screened regularly for cancer of the pelvic organs (ovaries, uterus, and  vagina). This screening involves a pelvic examination, including checking for microscopic changes to the surface of your cervix (Pap test). You may be encouraged to have this screening done every 3 years, beginning at age 21.  For women ages 30-65, health care providers may recommend pelvic exams and Pap testing every 3 years, or they may recommend the Pap and pelvic exam, combined with testing for human papilloma virus (HPV), every 5 years. Some types of HPV increase your risk of cervical cancer. Testing for HPV may also be done on women of any age with unclear Pap test results.  Other health care providers may not recommend any screening for nonpregnant women who are considered low risk for pelvic cancer and who do not have symptoms. Ask your health care provider if a screening pelvic exam is right for you.  If you have had past treatment for cervical cancer or a condition that could lead to cancer, you need Pap tests and screening for cancer for at least 20 years after your treatment. If Pap tests have been discontinued, your risk factors (such as having a new sexual partner) need to be reassessed to determine if screening should resume. Some women have medical problems that increase the chance of getting cervical cancer. In these cases, your health care provider may recommend more frequent screening and Pap tests. Colorectal Cancer  This type of cancer can be detected and often prevented.  Routine colorectal cancer screening usually begins at 60 years of age and continues through 60 years of age.  Your health care provider may recommend screening at an earlier age if you have risk factors for colon cancer.  Your health care provider may also recommend using home test kits to check for hidden blood in the stool.  A small camera at the end of a tube can be used to examine your colon directly (sigmoidoscopy or colonoscopy). This is done to check for the earliest forms of colorectal  cancer.  Routine screening usually begins at age 50.  Direct examination of the colon should be repeated every 5-10 years through 60 years of age. However, you may need to be screened more often if early forms of precancerous polyps or small growths are found. Skin Cancer  Check your skin from head to toe regularly.  Tell your health care provider about any new moles or changes in moles, especially if there is a change in a mole's shape or color.  Also tell your health care provider if you have a mole that is larger than the size of a pencil eraser.  Always use sunscreen. Apply sunscreen liberally and repeatedly throughout the day.  Protect yourself by wearing long sleeves, pants, a wide-brimmed hat, and sunglasses whenever you are outside. HEART DISEASE, DIABETES, AND HIGH BLOOD PRESSURE   High blood pressure causes heart disease and increases the risk of stroke. High blood pressure is more likely to develop in:  People who have blood pressure in the high end   of the normal range (130-139/85-89 mm Hg).  People who are overweight or obese.  People who are African American.  If you are 38-23 years of age, have your blood pressure checked every 3-5 years. If you are 61 years of age or older, have your blood pressure checked every year. You should have your blood pressure measured twice--once when you are at a hospital or clinic, and once when you are not at a hospital or clinic. Record the average of the two measurements. To check your blood pressure when you are not at a hospital or clinic, you can use:  An automated blood pressure machine at a pharmacy.  A home blood pressure monitor.  If you are between 45 years and 39 years old, ask your health care provider if you should take aspirin to prevent strokes.  Have regular diabetes screenings. This involves taking a blood sample to check your fasting blood sugar level.  If you are at a normal weight and have a low risk for diabetes,  have this test once every three years after 60 years of age.  If you are overweight and have a high risk for diabetes, consider being tested at a younger age or more often. PREVENTING INFECTION  Hepatitis B  If you have a higher risk for hepatitis B, you should be screened for this virus. You are considered at high risk for hepatitis B if:  You were born in a country where hepatitis B is common. Ask your health care provider which countries are considered high risk.  Your parents were born in a high-risk country, and you have not been immunized against hepatitis B (hepatitis B vaccine).  You have HIV or AIDS.  You use needles to inject street drugs.  You live with someone who has hepatitis B.  You have had sex with someone who has hepatitis B.  You get hemodialysis treatment.  You take certain medicines for conditions, including cancer, organ transplantation, and autoimmune conditions. Hepatitis C  Blood testing is recommended for:  Everyone born from [birth year] through 1965.  Anyone with known risk factors for hepatitis C. Sexually transmitted infections (STIs)  You should be screened for sexually transmitted infections (STIs) including gonorrhea and chlamydia if:  You are sexually active and are younger than 60 years of age.  You are older than 60 years of age and your health care provider tells you that you are at risk for this type of infection.  Your sexual activity has changed since you were last screened and you are at an increased risk for chlamydia or gonorrhea. Ask your health care provider if you are at risk.  If you do not have HIV, but are at risk, it may be recommended that you take a prescription medicine daily to prevent HIV infection. This is called pre-exposure prophylaxis (PrEP). You are considered at risk if:  You are sexually active and do not regularly use condoms or know the HIV status of your partner(s).  You take drugs by injection.  You are sexually  active with a partner who has HIV. Talk with your health care provider about whether you are at high risk of being infected with HIV. If you choose to begin PrEP, you should first be tested for HIV. You should then be tested every 3 months for as long as you are taking PrEP.  PREGNANCY   If you are premenopausal and you may become pregnant, ask your health care provider about preconception counseling.  If you may  become pregnant, take 400 to 800 micrograms (mcg) of folic acid every day.  If you want to prevent pregnancy, talk to your health care provider about birth control (contraception). OSTEOPOROSIS AND MENOPAUSE   Osteoporosis is a disease in which the bones lose minerals and strength with aging. This can result in serious bone fractures. Your risk for osteoporosis can be identified using a bone density scan.  If you are 61 years of age or older, or if you are at risk for osteoporosis and fractures, ask your health care provider if you should be screened.  Ask your health care provider whether you should take a calcium or vitamin D supplement to lower your risk for osteoporosis.  Menopause may have certain physical symptoms and risks.  Hormone replacement therapy may reduce some of these symptoms and risks. Talk to your health care provider about whether hormone replacement therapy is right for you.  HOME CARE INSTRUCTIONS   Schedule regular health, dental, and eye exams.  Stay current with your immunizations.   Do not use any tobacco products including cigarettes, chewing tobacco, or electronic cigarettes.  If you are pregnant, do not drink alcohol.  If you are breastfeeding, limit how much and how often you drink alcohol.  Limit alcohol intake to no more than 1 drink per day for nonpregnant women. One drink equals 12 ounces of beer, 5 ounces of wine, or 1 ounces of hard liquor.  Do not use street drugs.  Do not share needles.  Ask your health care provider for help if  you need support or information about quitting drugs.  Tell your health care provider if you often feel depressed.  Tell your health care provider if you have ever been abused or do not feel safe at home.   This information is not intended to replace advice given to you by your health care provider. Make sure you discuss any questions you have with your health care provider.   Document Released: 06/27/2011 Document Revised: 01/02/2015 Document Reviewed: 11/13/2013 Elsevier Interactive Patient Education [employer].

## 2016-03-21 ENCOUNTER — Encounter: Payer: Self-pay | Admitting: Women's Health

## 2016-03-22 ENCOUNTER — Telehealth: Payer: Self-pay | Admitting: Gynecology

## 2016-03-22 NOTE — Telephone Encounter (Signed)
Pam, serum calcium 9.4 01/15/2016, I think she will need 2 more doses of Prolia to complete 5 years.Marland Kitchenlast dose Oct. 2016, started Oct 2012, but missed a dose , Note from [employer].

## 2016-04-05 NOTE — Telephone Encounter (Addendum)
PC to [facility], Injection Prolia due after 04/14/16 ,  Insurance benefit    No deductible, So pay $85 with or without OV,PA needed approval. No referral required.

## 2016-04-12 NOTE — Telephone Encounter (Addendum)
[name] will call and schedule appointment with Nurse only for Prolia injection after 04/14/16.   PA approval ref # [id number]  Valid 03/30/16 thru 12/25/2016   1 unit = 2 injections (one every 6 months).

## 2016-04-13 ENCOUNTER — Telehealth (HOSPITAL_COMMUNITY): Payer: Self-pay | Admitting: Physical Therapy

## 2016-04-13 NOTE — Telephone Encounter (Signed)
She went somewhere else b/c she could get in earlier. NF

## 2016-04-19 ENCOUNTER — Ambulatory Visit (HOSPITAL_COMMUNITY): Payer: BC Managed Care – PPO | Admitting: Physical Therapy

## 2016-05-30 NOTE — Telephone Encounter (Signed)
Prolia scheduled 06/08/16 Next injection due after 12/09/16

## 2016-06-06 NOTE — Telephone Encounter (Signed)
PC from patient regarding orthopedic  Rodell Perna MD was very concerned about receiving Prolia injections. I tried to return pc to patient due to she needs to be referred to Dr Toney Rakes, MD. Unable to leave a message on VM no answer on phone.

## 2016-06-07 NOTE — Telephone Encounter (Signed)
Sounds to me like we need a consult in the office not by phone

## 2016-06-07 NOTE — Telephone Encounter (Signed)
Teresa Perna MD want her to stop Prolia for 6 months for a rest for her bones, and her pharmacy told her to stop due to no more than 3 years. She has been on Prolia and Oral meds for ten years. Very concerned about this. She has received 8 injections from 11/2011 thru 09/2015. She wants to talk with you before another injection she saw Elon Alas in 02/2016, she had a heel fracture in January and had surgery and is now in a boot.

## 2016-06-08 ENCOUNTER — Ambulatory Visit: Payer: BC Managed Care – PPO

## 2016-06-14 NOTE — Telephone Encounter (Signed)
Consult with JF 07/05/16

## 2016-07-05 ENCOUNTER — Ambulatory Visit (INDEPENDENT_AMBULATORY_CARE_PROVIDER_SITE_OTHER): Payer: BC Managed Care – PPO | Admitting: Gynecology

## 2016-07-05 ENCOUNTER — Encounter: Payer: Self-pay | Admitting: Gynecology

## 2016-07-05 VITALS — BP 128/82

## 2016-07-05 DIAGNOSIS — M810 Age-related osteoporosis without current pathological fracture: Secondary | ICD-10-CM | POA: Diagnosis not present

## 2016-07-05 NOTE — Progress Notes (Signed)
   HPI: Patient is a 60 year old that presented to the office to discuss her osteoporosis treatment for which she has been on Prolia for the past 4 years. Patient had questions on the menopause and vitamin D she should be taking.  Review of her record indicated that she was started on Prolia in 2012 the following is a summary of her injections:  December 2012 June 2013 December 2013 July 2014 January 2015 did not receive injection September 2015  Prior to 2012 she had been on Boniva and Fosamax prescribed by my retired partner. She has a history of gastroesophageal reflux which she has seen a gastroenterologist.  Patient January this year had a calcaneus fracture and reinjured it is currently on a boot (left)   Patient's most recent bone density study was in 2016 which demonstrated the lowest T score was -2.6 at the left femoral neck An when compared with 2014 had a statistically improvement of the AP spine and left femoral neck and no change on the right femoral neck. Since initiating the Prolia she has responded well. After talking with the orthopedic surgeon she had questions about continuing one more year which would've been this year she was overdue for injection recently and is due for one more injection at the end of this year.  She brought her medicine bottles whether it appears that she was taking too much calcium. Review of her record indicated in January her calcium level was 9. Her last vitamin D level was in 2014 which was normal and PTH level in 2015. She is currently on the Estring 2 mg intravaginally every 3 months for vasomotor symptoms   ROS: A ROS was performed and pertinent positives and negatives are included in the history.  GENERAL: No fevers or chills. HEENT: No change in vision, no earache, sore throat or sinus congestion. NECK: No pain or stiffness. CARDIOVASCULAR: No chest pain or pressure. No palpitations. PULMONARY: No shortness of breath, cough or wheeze.  GASTROINTESTINAL: No abdominal pain, nausea, vomiting or diarrhea, melena or bright red blood per rectum. GENITOURINARY: No urinary frequency, urgency, hesitancy or dysuria. MUSCULOSKELETAL: No joint or muscle pain, no back pain, no recent trauma. DERMATOLOGIC: No rash, no itching, no lesions. ENDOCRINE: No polyuria, polydipsia, no heat or cold intolerance. No recent change in weight. HEMATOLOGICAL: No anemia or easy bruising or bleeding. NEUROLOGIC: No headache, seizures, numbness, tingling or weakness. PSYCHIATRIC: No depression, no loss of interest in normal activity or change in sleep pattern.    Assessment Plan: Extensive discussion on monitoring as well as treatment of osteoporosis as well as different antiresorptive agents and length of treatment were discussed with the patient. She has had great response with the Prolia for past 4 years with stability of her bone mass. She will go on a drug holiday she is due for bone density study in January 2010. She was instructed that the maximum She Should Be Taking Should Not Exceed 1200 Mg Daily or 2000 Units of Vitamin D. We Are Going to Check a Calcium, Vitamin D and PTH Level Today.    Greater than 50% of time was spent in counseling and coordinating care of this patient.   Time of consultation: 15   Minutes.

## 2016-07-06 LAB — PTH, INTACT AND CALCIUM
CALCIUM: 10 mg/dL (ref 8.4–10.5)
PTH: 28 pg/mL (ref 14–64)

## 2016-07-06 LAB — VITAMIN D 25 HYDROXY (VIT D DEFICIENCY, FRACTURES): Vit D, 25-Hydroxy: 42 ng/mL (ref 30–100)

## 2016-07-19 NOTE — Telephone Encounter (Signed)
Per Note from Dr Toney Rakes. [name] will be on a drug holiday from [place]  and will recheck bone density in January.

## 2016-10-03 ENCOUNTER — Other Ambulatory Visit: Payer: Self-pay | Admitting: Gynecology

## 2016-10-03 DIAGNOSIS — Z1231 Encounter for screening mammogram for malignant neoplasm of breast: Secondary | ICD-10-CM

## 2016-11-01 ENCOUNTER — Ambulatory Visit
Admission: RE | Admit: 2016-11-01 | Discharge: 2016-11-01 | Disposition: A | Discharge status: Home / Self Care | Payer: BC Managed Care – PPO | Source: Ambulatory Visit | Attending: Gynecology | Admitting: Gynecology

## 2016-11-01 DIAGNOSIS — Z1231 Encounter for screening mammogram for malignant neoplasm of breast: Secondary | ICD-10-CM

## 2016-11-23 ENCOUNTER — Ambulatory Visit (INDEPENDENT_AMBULATORY_CARE_PROVIDER_SITE_OTHER): Payer: BC Managed Care – PPO | Admitting: Women's Health

## 2016-11-23 VITALS — BP 118/78

## 2016-11-23 DIAGNOSIS — B373 Candidiasis of vulva and vagina: Secondary | ICD-10-CM

## 2016-11-23 DIAGNOSIS — R140 Abdominal distension (gaseous): Secondary | ICD-10-CM | POA: Diagnosis not present

## 2016-11-23 DIAGNOSIS — B3731 Acute candidiasis of vulva and vagina: Secondary | ICD-10-CM

## 2016-11-23 DIAGNOSIS — F4322 Adjustment disorder with anxiety: Secondary | ICD-10-CM | POA: Diagnosis not present

## 2016-11-23 LAB — WET PREP FOR TRICH, YEAST, CLUE
Clue Cells Wet Prep HPF POC: NONE SEEN
TRICH WET PREP: NONE SEEN
WBC, Wet Prep HPF POC: NONE SEEN
YEAST WET PREP: NONE SEEN

## 2016-11-23 MED ORDER — FLUCONAZOLE 150 MG PO TABS: 150.0000 mg | ORAL_TABLET | Freq: Once | ORAL | 1 refills | Status: AC

## 2016-11-23 MED ORDER — ALPRAZOLAM 0.25 MG PO TABS: 0.2500 mg | ORAL_TABLET | ORAL | 1 refills | Status: AC | PRN

## 2016-11-23 NOTE — Progress Notes (Signed)
Presents for evaluation of white vaginal discharge and vaginal pruritis on and off for a few months. Denies any abdominal pain, urinary symptoms, fever, or chills. Has used Estring in the past but did stop felt like she no longer needed. Postmenopausal on no HRT. Complained of increased abdominal bloating and abdominal weight gain over the past 2 years. Requested refill of Xanax, uses sparingly, less than one prescription in one year.  Exam: Appears well. External genitalia: normal. Speculum: scant white discharge with no visible erythema or odor noted. Bimanual no CMT or adnexal fullness. Wet prep: Negative   Yeast vaginitis Pelvic bloating  Plan: Fluconazole 150mg  once if pruritis continues.  Encouraged to wear loose fitting clothing and no underwear to bed. A and D ointment suggested. Xanax .25 milligram tablet for anxiety and insomnia. Sleep hygiene reviewed. Warned of addictive properties of benzodiazepines. Instructed to call if symptoms worsen. Schedule ultrasound.

## 2016-11-23 NOTE — Patient Instructions (Signed)
Insomnia Insomnia is a sleep disorder that makes it difficult to fall asleep or to stay asleep. Insomnia can cause tiredness (fatigue), low energy, difficulty concentrating, mood swings, and poor performance at work or school. There are three different ways to classify insomnia:  Difficulty falling asleep.  Difficulty staying asleep.  Waking up too early in the morning. Any type of insomnia can be long-term (chronic) or short-term (acute). Both are common. Short-term insomnia usually lasts for three months or less. Chronic insomnia occurs at least three times a week for longer than three months. What are the causes? Insomnia may be caused by another condition, situation, or substance, such as:  Anxiety.  Certain medicines.  Gastroesophageal reflux disease (GERD) or other gastrointestinal conditions.  Asthma or other breathing conditions.  Restless legs syndrome, sleep apnea, or other sleep disorders.  Chronic pain.  Menopause. This may include hot flashes.  Stroke.  Abuse of alcohol, tobacco, or illegal drugs.  Depression.  Caffeine.  Neurological disorders, such as Alzheimer disease.  An overactive thyroid (hyperthyroidism). The cause of insomnia may not be known. What increases the risk? Risk factors for insomnia include:  Gender. Women are more commonly affected than men.  Age. Insomnia is more common as you get older.  Stress. This may involve your professional or personal life.  Income. Insomnia is more common in people with lower income.  Lack of exercise.  Irregular work schedule or night shifts.  Traveling between different time zones. What are the signs or symptoms? If you have insomnia, trouble falling asleep or trouble staying asleep is the main symptom. This may lead to other symptoms, such as:  Feeling fatigued.  Feeling nervous about going to sleep.  Not feeling rested in the morning.  Having trouble concentrating.  Feeling irritable,  anxious, or depressed. How is this treated? Treatment for insomnia depends on the cause. If your insomnia is caused by an underlying condition, treatment will focus on addressing the condition. Treatment may also include:  Medicines to help you sleep.  Counseling or therapy.  Lifestyle adjustments. Follow these instructions at home:  Take medicines only as directed by your health care provider.  Keep regular sleeping and waking hours. Avoid naps.  Keep a sleep diary to help you and your health care provider figure out what could be causing your insomnia. Include:  When you sleep.  When you wake up during the night.  How well you sleep.  How rested you feel the next day.  Any side effects of medicines you are taking.  What you eat and drink.  Make your bedroom a comfortable place where it is easy to fall asleep:  Put up shades or special blackout curtains to block light from outside.  Use a white noise machine to block noise.  Keep the temperature cool.  Exercise regularly as directed by your health care provider. Avoid exercising right before bedtime.  Use relaxation techniques to manage stress. Ask your health care provider to suggest some techniques that may work well for you. These may include:  Breathing exercises.  Routines to release muscle tension.  Visualizing peaceful scenes.  Cut back on alcohol, caffeinated beverages, and cigarettes, especially close to bedtime. These can disrupt your sleep.  Do not overeat or eat spicy foods right before bedtime. This can lead to digestive discomfort that can make it hard for you to sleep.  Limit screen use before bedtime. This includes:  Watching TV.  Using your smartphone, tablet, and computer.  Stick to a   routine. This can help you fall asleep faster. Try to do a quiet activity, brush your teeth, and go to bed at the same time each night.  Get out of bed if you are still awake after 15 minutes of trying to  sleep. Keep the lights down, but try reading or doing a quiet activity. When you feel sleepy, go back to bed.  Make sure that you drive carefully. Avoid driving if you feel very sleepy.  Keep all follow-up appointments as directed by your health care provider. This is important. Contact a health care provider if:  You are tired throughout the day or have trouble in your daily routine due to sleepiness.  You continue to have sleep problems or your sleep problems get worse. Get help right away if:  You have serious thoughts about hurting yourself or someone else. This information is not intended to replace advice given to you by your health care provider. Make sure you discuss any questions you have with your health care provider. Document Released: 12/09/2000 Document Revised: 05/13/2016 Document Reviewed: 09/12/2014 Elsevier Interactive Patient Education  2017 [employer]  

## 2016-12-07 ENCOUNTER — Other Ambulatory Visit: Payer: Self-pay | Admitting: Women's Health

## 2016-12-07 ENCOUNTER — Ambulatory Visit (INDEPENDENT_AMBULATORY_CARE_PROVIDER_SITE_OTHER): Payer: BC Managed Care – PPO | Admitting: Women's Health

## 2016-12-07 ENCOUNTER — Ambulatory Visit (INDEPENDENT_AMBULATORY_CARE_PROVIDER_SITE_OTHER): Payer: BC Managed Care – PPO

## 2016-12-07 VITALS — BP 132/84

## 2016-12-07 DIAGNOSIS — N760 Acute vaginitis: Secondary | ICD-10-CM

## 2016-12-07 DIAGNOSIS — R938 Abnormal findings on diagnostic imaging of other specified body structures: Secondary | ICD-10-CM | POA: Diagnosis not present

## 2016-12-07 DIAGNOSIS — N840 Polyp of corpus uteri: Secondary | ICD-10-CM | POA: Diagnosis not present

## 2016-12-07 DIAGNOSIS — B9689 Other specified bacterial agents as the cause of diseases classified elsewhere: Secondary | ICD-10-CM

## 2016-12-07 DIAGNOSIS — R9389 Abnormal findings on diagnostic imaging of other specified body structures: Secondary | ICD-10-CM

## 2016-12-07 DIAGNOSIS — R140 Abdominal distension (gaseous): Secondary | ICD-10-CM

## 2016-12-07 LAB — WET PREP FOR TRICH, YEAST, CLUE
Trich, Wet Prep: NONE SEEN
WBC, Wet Prep HPF POC: NONE SEEN
YEAST WET PREP: NONE SEEN

## 2016-12-07 MED ORDER — METRONIDAZOLE 0.75 % VA GEL: VAGINAL | 0 refills | Status: DC

## 2016-12-07 NOTE — Patient Instructions (Signed)

## 2016-12-07 NOTE — Progress Notes (Signed)
Cents for follow-up ultrasound at annual exam reported low abdominal bloating. Has used the Estring but thought it dissolved has not taken it out in about a year.  Exam: Appears well. External genitalia within normal limits, speculum exam scant discharge, wet prep positive for few clues, many bacteria. Ultrasound: T/A anteverted uterus homogeneous with right solid focus within endometrium 8 x 5 mm increased from 2015 ultrasound. Negative CFD. Right and left ovary normal. Negative CDS. Ring artifact within vaginal area/Estring. Estring removed and discarded Tearful  Bacteria vaginosis Questionable endometrial solid focus Anxiety  Plan: MetroGel vaginal cream 1 applicator at bedtime 5, alcohol precautions reviewed. Schedule sonohysterogram with Dr. Toney Rakes reviewed questionable polyp . Reassurance given.

## 2016-12-19 IMAGING — DX DG FOOT COMPLETE 3+V*L*
3 series · 3 of 3 positions shown · non-contrast
Comparison: No priors.

CLINICAL DATA: 59-year-old female with history of fall down several
steps tonight complaining of pain in the left foot and ankle.

EXAM:
LEFT FOOT - COMPLETE 3+ VIEW

[foot ap]
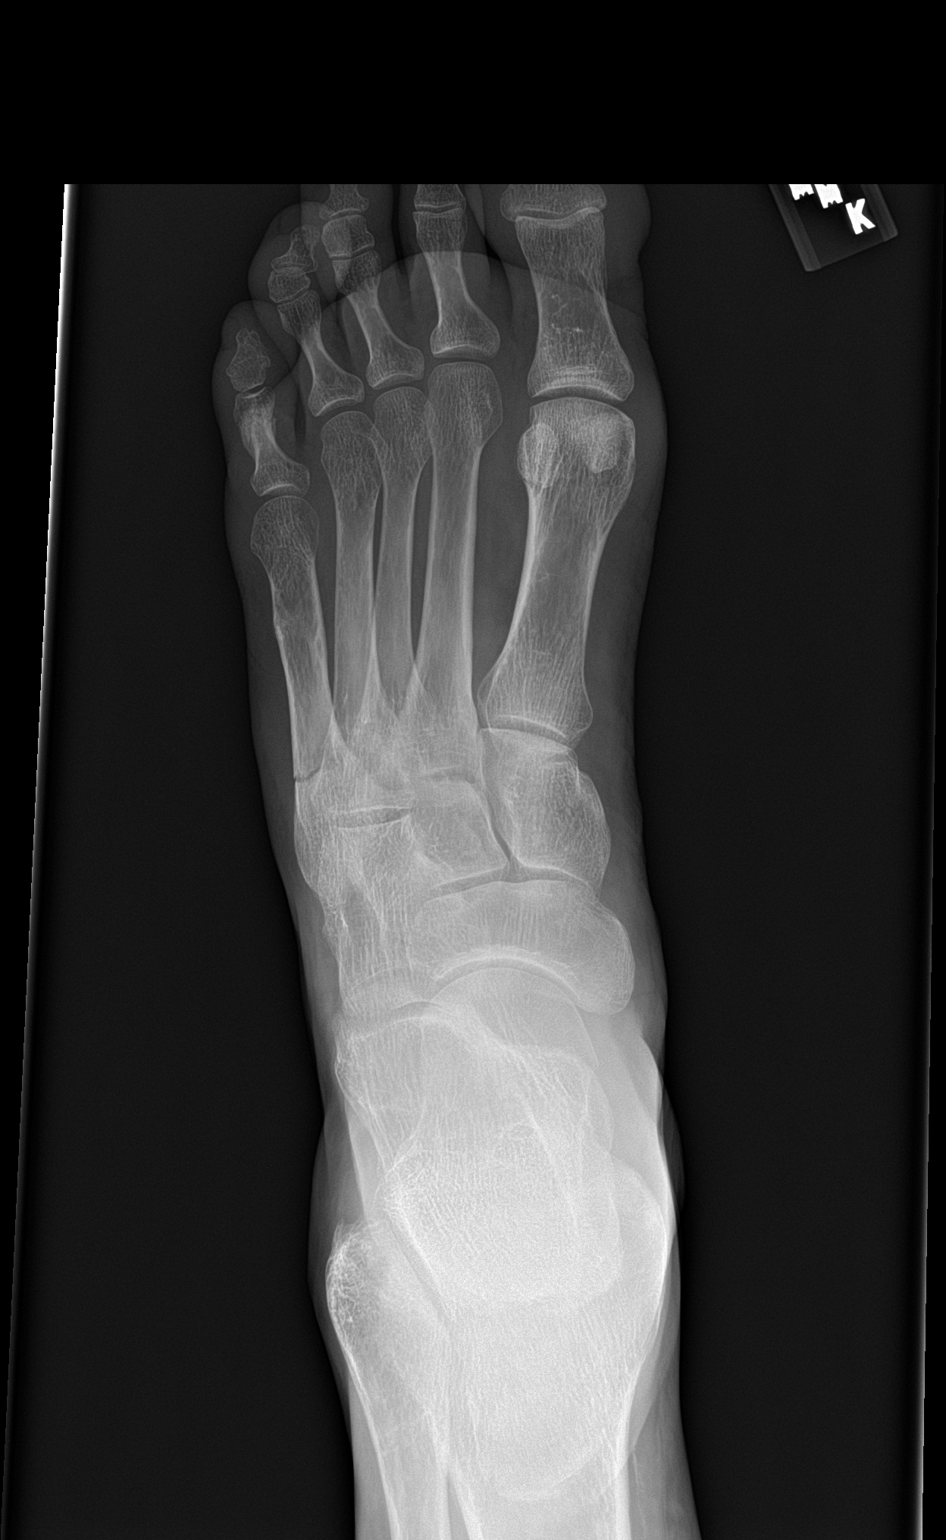

[foot obl]
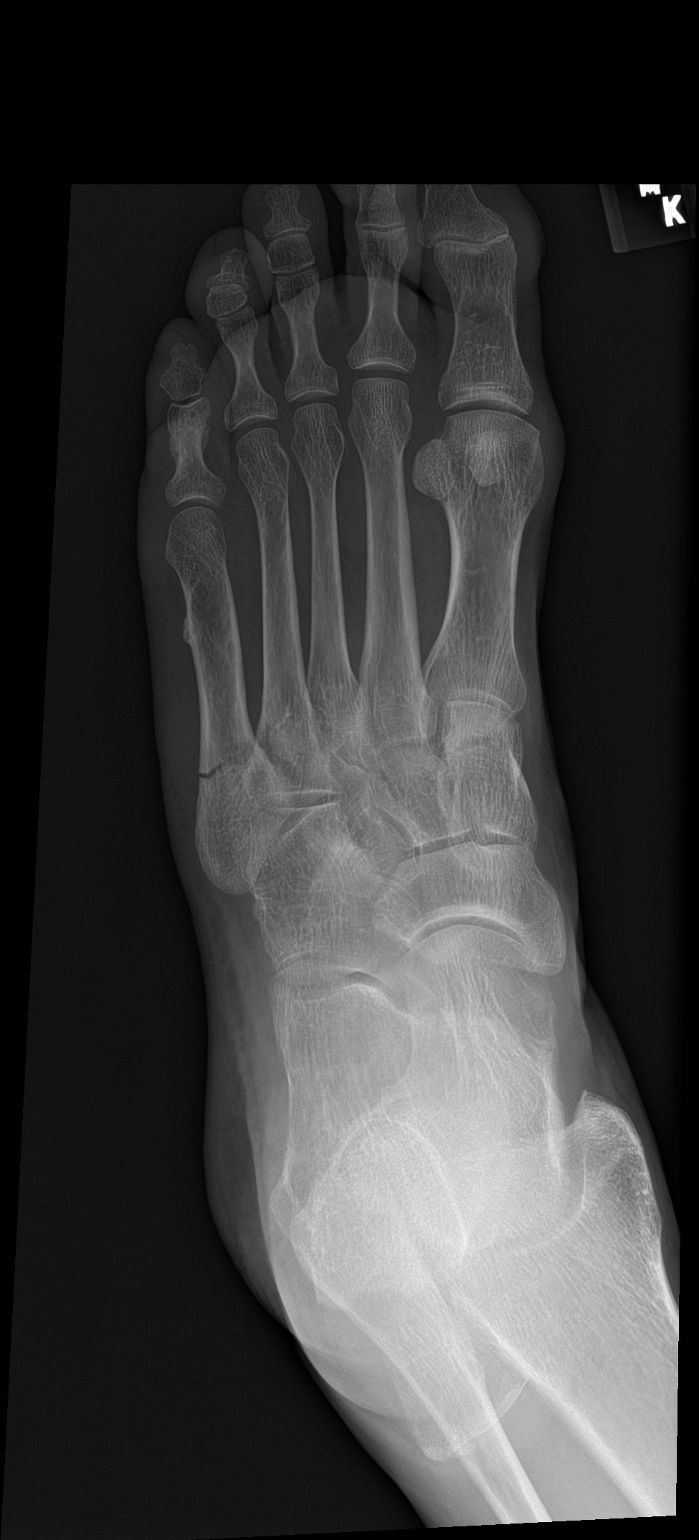

[foot lat]
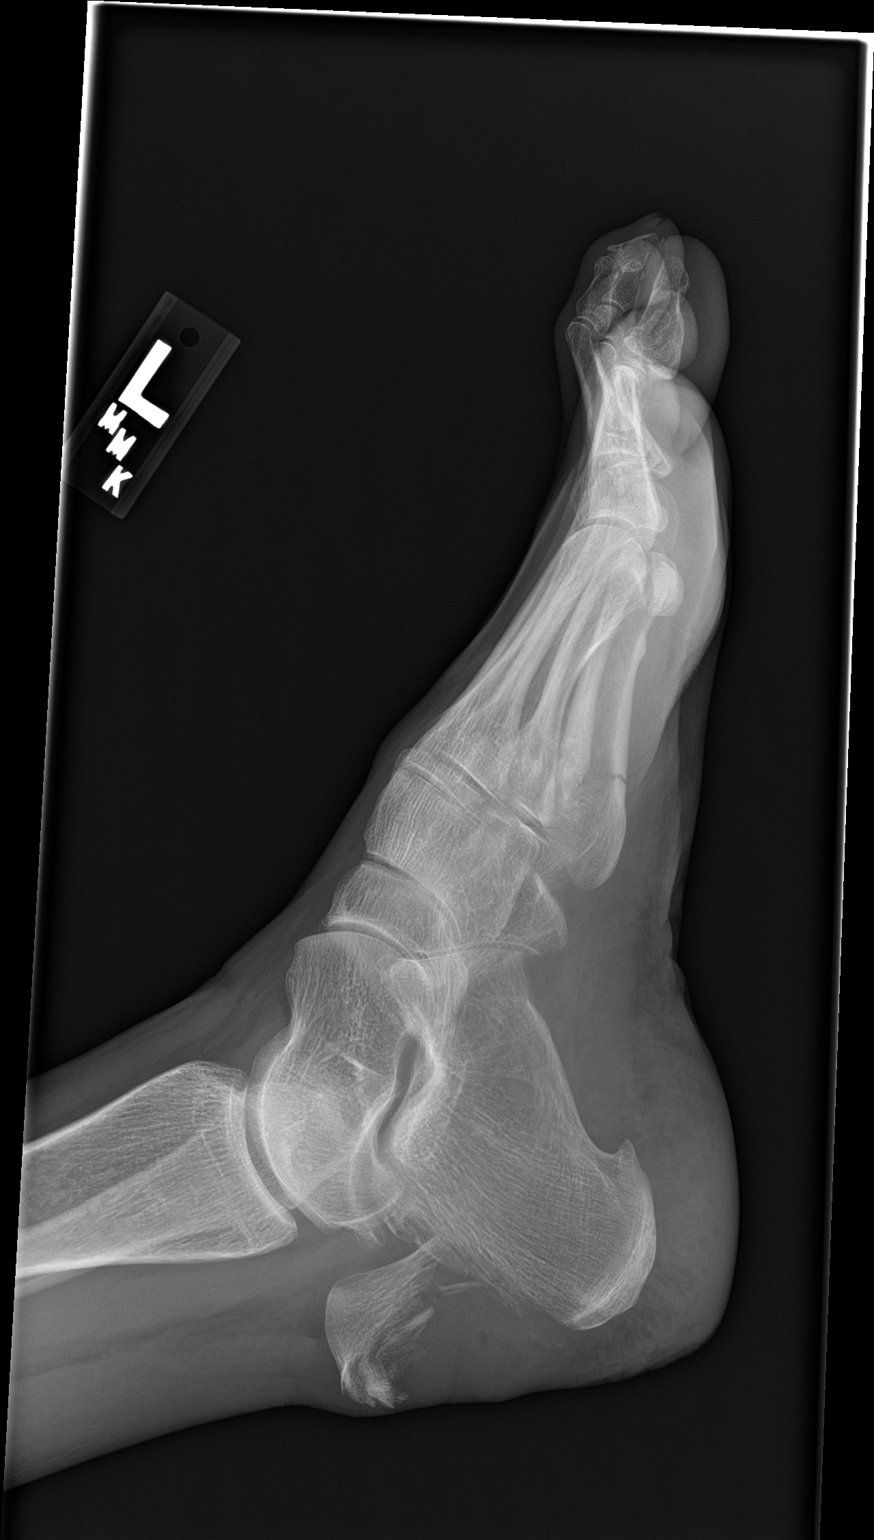

[3 of 3 positions shown; findings below may reference images not displayed]

FINDINGS: Nondisplaced fracture through the base of the fifth metatarsal.
Displaced mildly comminuted avulsion fracture from the dorsal aspect
of the calcaneus, with a up to 3.7 cm of proximal migration of the
fracture fragment. Remaining bones of the foot otherwise appear
intact.
IMPRESSION: 1. Displaced mildly comminuted avulsion fracture of the dorsal
aspect of the calcaneus, as above.
2. Nondisplaced fractures through the base of the fifth metatarsal.

## 2016-12-22 IMAGING — RF DG C-ARM 61-120 MIN
1 series · 3 of 3 positions shown · non-contrast
Comparison: 01/12/2016

CLINICAL DATA: Open reduction internal fixation calcaneal fracture
.

EXAM:
DG C-ARM 61-120 MIN; LEFT ANKLE COMPLETE - 3+ VIEW

[Series 1: run · 3 of 3 slices shown]
[im 1/3]
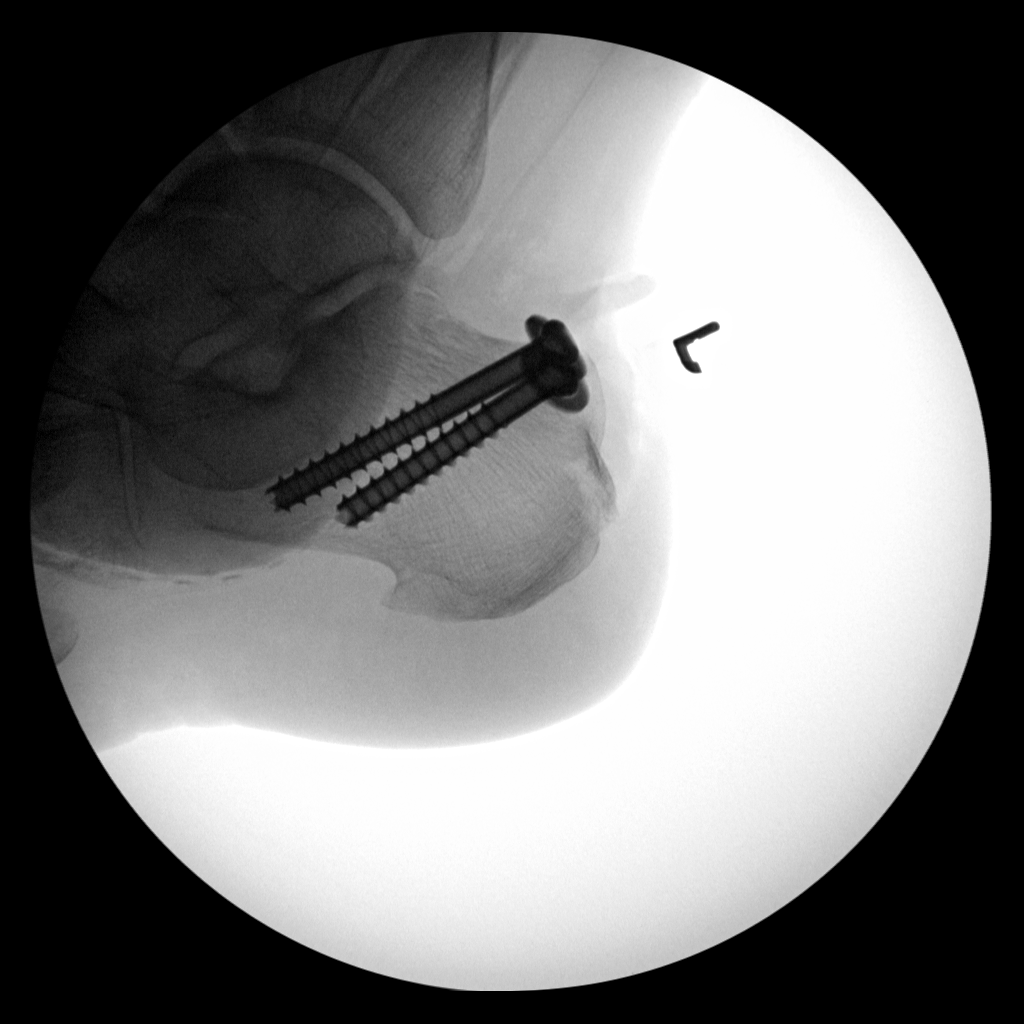
[im 2/3]
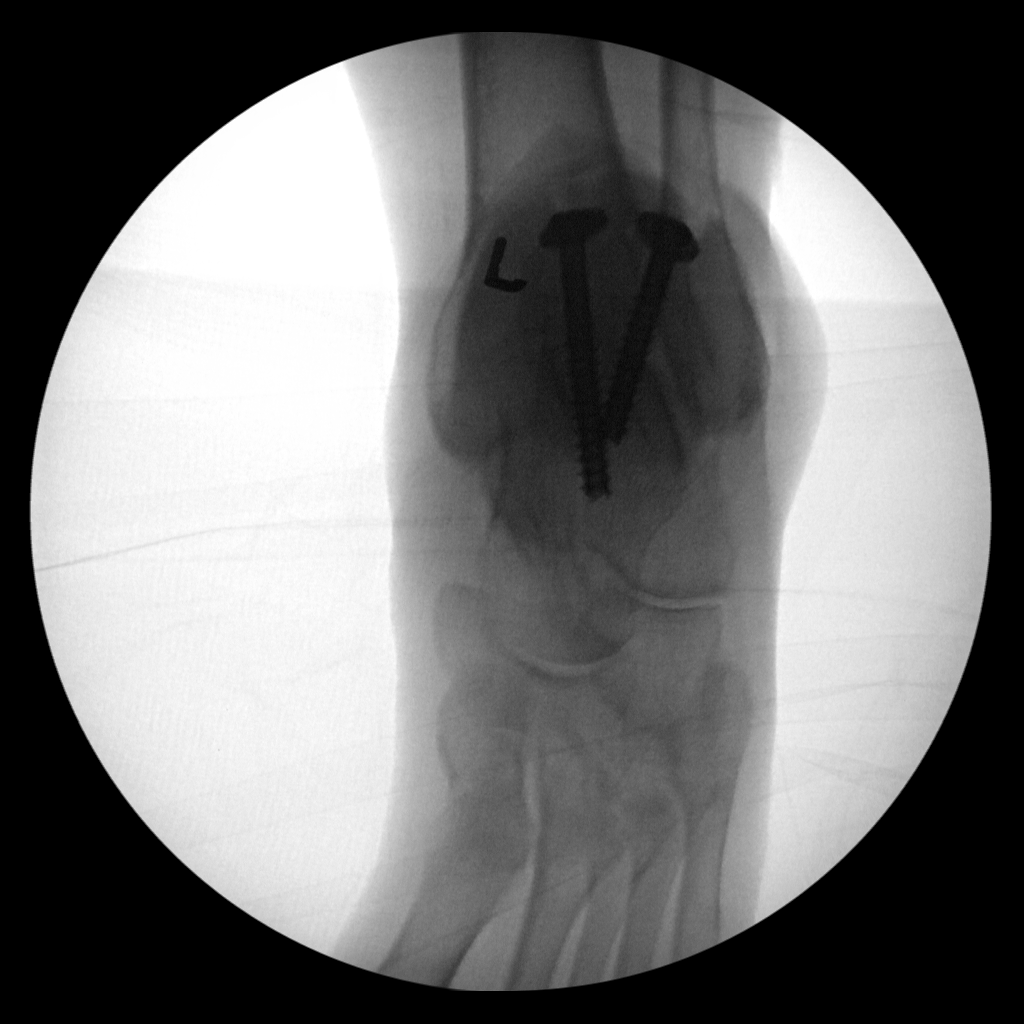
[im 3/3]
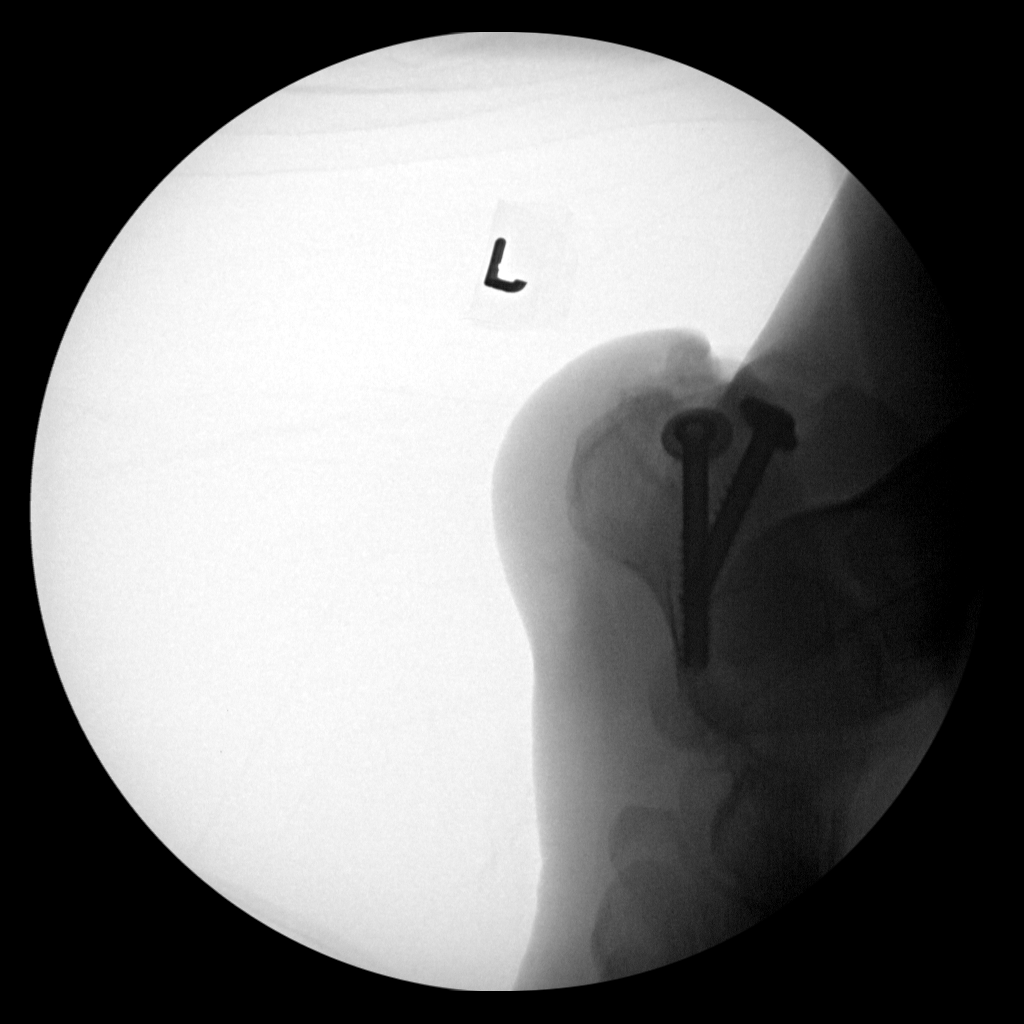

[3 of 3 positions shown; findings below may reference images not displayed]

FINDINGS: Open reduction internal fixation calcaneal fracture. Screws are
intact. Good anatomic alignment. 0 minutes 28 seconds fluoroscopy
time. Three images.
IMPRESSION: ORIF calcaneal fracture .

## 2016-12-22 IMAGING — CR DG CHEST 2V
2 series · 2 of 2 positions shown · non-contrast
Comparison: None.

CLINICAL DATA: Preoperative evaluation for hindfoot region fracture
repair. Hypertension.

EXAM:
CHEST  2 VIEW

[w chest lat]
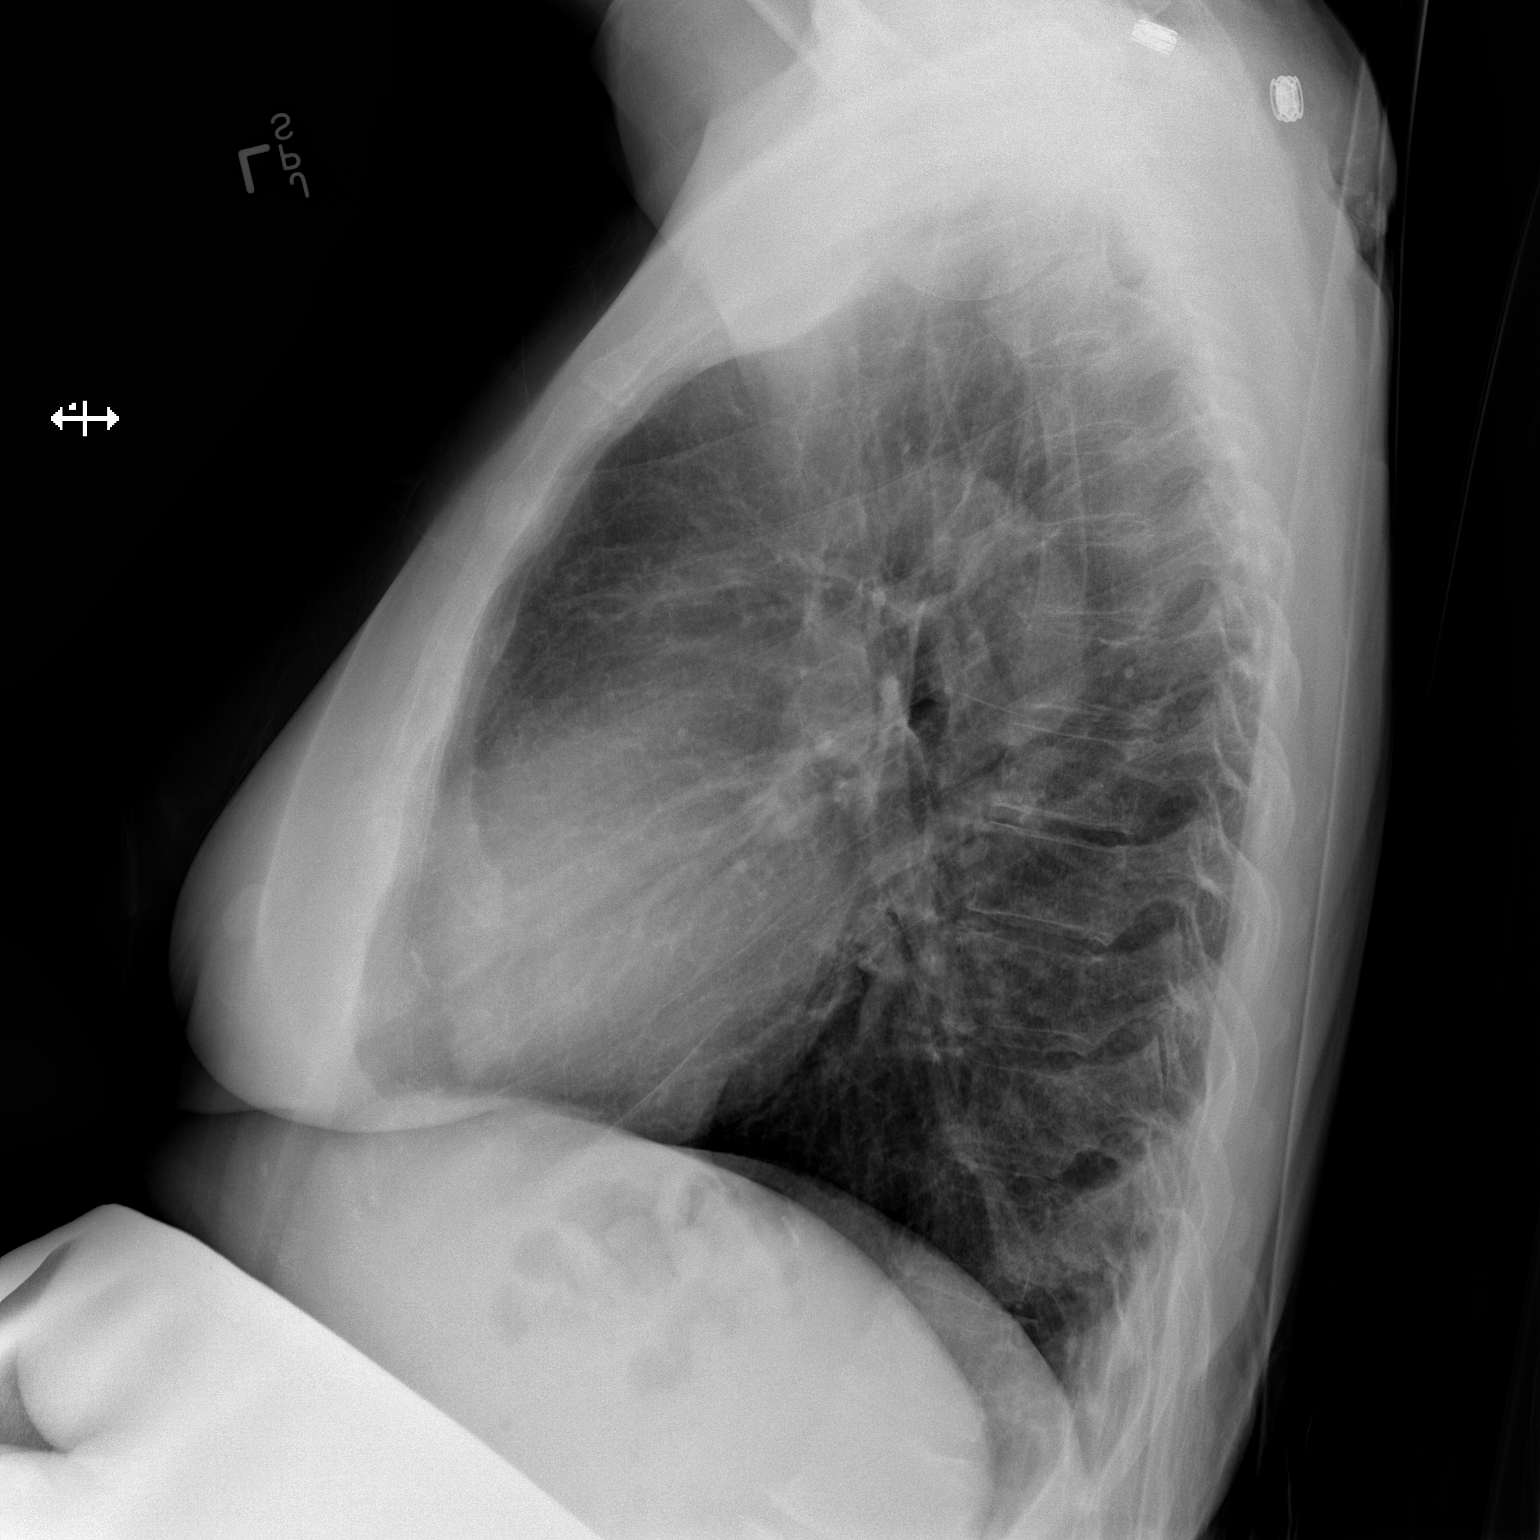

[x chest ap]
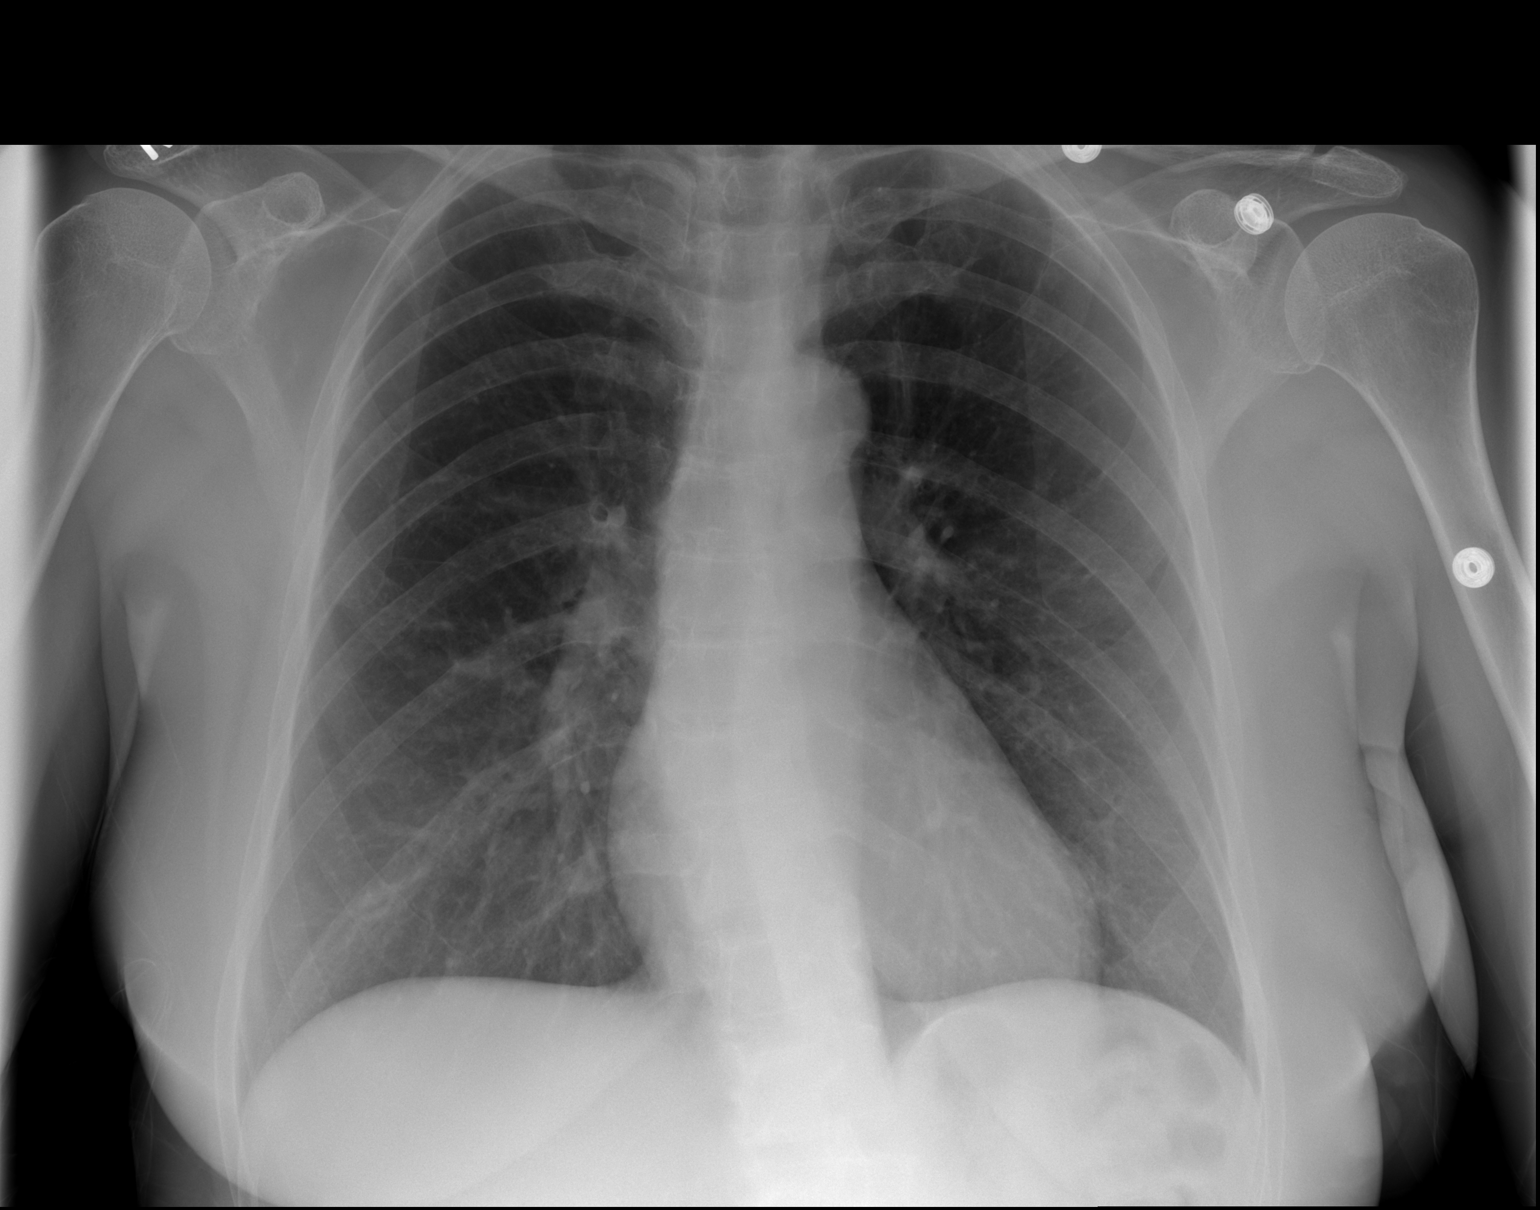

[2 of 2 positions shown; findings below may reference images not displayed]

FINDINGS: Lungs are clear. Heart size and pulmonary vascularity are normal. No
adenopathy. There is thoracolumbar levoscoliosis.
IMPRESSION: No edema or consolidation.

## 2016-12-30 ENCOUNTER — Other Ambulatory Visit: Payer: Self-pay | Admitting: Gynecology

## 2016-12-30 ENCOUNTER — Ambulatory Visit (INDEPENDENT_AMBULATORY_CARE_PROVIDER_SITE_OTHER): Payer: BC Managed Care – PPO | Admitting: Gynecology

## 2016-12-30 ENCOUNTER — Encounter: Payer: Self-pay | Admitting: Gynecology

## 2016-12-30 ENCOUNTER — Ambulatory Visit (INDEPENDENT_AMBULATORY_CARE_PROVIDER_SITE_OTHER): Payer: BC Managed Care – PPO

## 2016-12-30 DIAGNOSIS — R9389 Abnormal findings on diagnostic imaging of other specified body structures: Secondary | ICD-10-CM

## 2016-12-30 DIAGNOSIS — N840 Polyp of corpus uteri: Secondary | ICD-10-CM

## 2016-12-30 DIAGNOSIS — Z780 Asymptomatic menopausal state: Secondary | ICD-10-CM

## 2016-12-30 DIAGNOSIS — R938 Abnormal findings on diagnostic imaging of other specified body structures: Secondary | ICD-10-CM | POA: Diagnosis not present

## 2016-12-30 NOTE — Progress Notes (Signed)
  Patient is a 61 year old that was instructed to come to the office today for sonohysterogram due to the fact and ultrasound had recently been ordered because patient was complaining of low abdominal bloating at which time the ultrasound had demonstrated an echogenic focus in the endometrial cavity and she's here for sonohysterogram and endometrial biopsy.  Ultrasound: Uterus measured 5.4 x 3.8 x 2.7 mm. An echogenic focus measuring 4 x 8 mm was noted no change from several years ago. Right and left ovary were normal. The cervix then cleansed with Betadine solution and a sterile catheter was introduced into the uterine cavity and the little defect in the fundus of the uterus measured 8 x 4 mm was noted. Following this a sterile Pipelle was introduced into the uterine cavity and required several passages in an effort to obtain tissue to submit for histological evaluation.  Patient early this year had been placed on Estring for vasomotor symptoms and she had forgotten to remove it was recently removed and she was treated for potential BV. Patient reports no vaginal bleeding.  Assessment/plan: 61 year old with appears to be a small endometrial polyp that hasn't changed in several years was given the option of treatment with high dose progesterone for 3 months and repeat sonohysterogram that she has opted to proceed with resectoscopic polypectomy which will be scheduled an outpatient procedure. Will notify her with the results of the pathology as soon as it becomes available. Literature information was provided.

## 2017-01-02 ENCOUNTER — Telehealth: Payer: Self-pay | Admitting: Obstetrics & Gynecology

## 2017-01-02 NOTE — Telephone Encounter (Signed)
Patient called through the paging operator to ask questions about a recent diagnosis of polyps.  After multiple attempts to call pt back (on the number she provided with corresponds to her home phone number) a brief message was left that I could be reached again through the paging operator.

## 2017-01-03 ENCOUNTER — Telehealth: Payer: Self-pay | Admitting: *Deleted

## 2017-01-03 NOTE — Telephone Encounter (Signed)
Pt was informed the pathology results yesterday. I spoke with her today and she reassured.

## 2017-01-03 NOTE — Telephone Encounter (Signed)
Pt called had one episode of bleeding this am, not heavy, but no spotting. No bleeding now, no bleeding over weekend. Pt said she did have light bleeding after [facility] on 12/30/16. She asked me to relay this information to you. Please advise

## 2017-01-03 NOTE — Telephone Encounter (Signed)
Pt informed with the below note, will keep a watch on it.

## 2017-01-03 NOTE — Telephone Encounter (Signed)
We'll take care of it soon at time of her surgery her biopsy once again was benign no cancer

## 2017-01-03 NOTE — Telephone Encounter (Signed)
Please call patient and tell her that I know that she had call after hours and the Dr. from another group that was coming for [place] tried to call her. Once again her pathology report was benign there was no endometrial polyp on the biopsy specimen. Please reassure

## 2017-01-11 ENCOUNTER — Ambulatory Visit: Payer: BC Managed Care – PPO | Admitting: Gynecology

## 2017-01-16 ENCOUNTER — Ambulatory Visit (INDEPENDENT_AMBULATORY_CARE_PROVIDER_SITE_OTHER): Payer: BC Managed Care – PPO | Admitting: Gynecology

## 2017-01-16 ENCOUNTER — Encounter: Payer: Self-pay | Admitting: Gynecology

## 2017-01-16 VITALS — BP 136/88

## 2017-01-16 DIAGNOSIS — N760 Acute vaginitis: Secondary | ICD-10-CM

## 2017-01-16 DIAGNOSIS — N952 Postmenopausal atrophic vaginitis: Secondary | ICD-10-CM | POA: Diagnosis not present

## 2017-01-16 DIAGNOSIS — Z01818 Encounter for other preprocedural examination: Secondary | ICD-10-CM

## 2017-01-16 DIAGNOSIS — B373 Candidiasis of vulva and vagina: Secondary | ICD-10-CM

## 2017-01-16 DIAGNOSIS — N898 Other specified noninflammatory disorders of vagina: Secondary | ICD-10-CM | POA: Diagnosis not present

## 2017-01-16 DIAGNOSIS — B3731 Acute candidiasis of vulva and vagina: Secondary | ICD-10-CM

## 2017-01-16 DIAGNOSIS — B9689 Other specified bacterial agents as the cause of diseases classified elsewhere: Secondary | ICD-10-CM | POA: Diagnosis not present

## 2017-01-16 LAB — WET PREP FOR TRICH, YEAST, CLUE
Clue Cells Wet Prep HPF POC: NONE SEEN
Trich, Wet Prep: NONE SEEN

## 2017-01-16 MED ORDER — FLUCONAZOLE 100 MG PO TABS: 100.0000 mg | ORAL_TABLET | Freq: Every day | ORAL | 1 refills | Status: DC

## 2017-01-16 MED ORDER — TINIDAZOLE 500 MG PO TABS: ORAL_TABLET | ORAL | 0 refills | Status: DC

## 2017-01-16 NOTE — Patient Instructions (Signed)
Vaginitis Vaginitis is an inflammation of the vagina. It can happen when the normal bacteria and yeast in the vagina grow too much. There are different types. Treatment will depend on the type you have. Follow these instructions at home:  Take all medicines as told by your doctor.  Keep your vagina area clean and dry. Avoid soap. Rinse the area with water.  Avoid washing and cleaning out the vagina (douching).  Do not use tampons or have sex (intercourse) until your treatment is done.  Wipe from front to back after going to the restroom.  Wear cotton underwear.  Avoid wearing underwear while you sleep until your vaginitis is gone.  Avoid tight pants. Avoid underwear or nylons without a cotton panel.  Take off wet clothing (such as a bathing suit) as soon as you can.  Use mild, unscented products. Avoid fabric softeners and scented:  Feminine sprays.  Laundry detergents.  Tampons.  Soaps or bubble baths.  Practice safe sex and use condoms. Get help right away if:  You have belly (abdominal) pain.  You have a fever or lasting symptoms for more than 2-3 days.  You have a fever and your symptoms suddenly get worse. This information is not intended to replace advice given to you by your health care provider. Make sure you discuss any questions you have with your health care provider. Document Released: 03/10/2009 Document Revised: 05/19/2016 Document Reviewed: 05/24/2012 Elsevier Interactive Patient Education  2017 [employer] Vaginal Yeast infection, Adult Vaginal yeast infection is a condition that causes soreness, swelling, and redness (inflammation) of the vagina. It also causes vaginal discharge. This is a common condition. Some women get this infection frequently. What are the causes? This condition is caused by a change in the normal balance of the yeast (candida) and bacteria that live in the vagina. This change causes an overgrowth of yeast, which causes the  inflammation. What increases the risk? This condition is more likely to develop in:  Women who take antibiotic medicines.  Women who have diabetes.  Women who take birth control pills.  Women who are pregnant.  Women who douche often.  Women who have a weak defense (immune) system.  Women who have been taking steroid medicines for a long time.  Women who frequently wear tight clothing. What are the signs or symptoms? Symptoms of this condition include:  White, thick vaginal discharge.  Swelling, itching, redness, and irritation of the vagina. The lips of the vagina (vulva) may be affected as well.  Pain or a burning feeling while urinating.  Pain during sex. How is this diagnosed? This condition is diagnosed with a medical history and physical exam. This will include a pelvic exam. Your health care provider will examine a sample of your vaginal discharge under a microscope. Your health care provider may send this sample for testing to confirm the diagnosis. How is this treated? This condition is treated with medicine. Medicines may be over-the-counter or prescription. You may be told to use one or more of the following:  Medicine that is taken orally.  Medicine that is applied as a cream.  Medicine that is inserted directly into the vagina (suppository). Follow these instructions at home:  Take or apply over-the-counter and prescription medicines only as told by your health care provider.  Do not have sex until your health care provider has approved. Tell your sex partner that you have a yeast infection. That person should go to his or her health care provider if he  or she develops symptoms.  Do not wear tight clothes, such as pantyhose or tight pants.  Avoid using tampons until your health care provider approves.  Eat more yogurt. This may help to keep your yeast infection from returning.  Try taking a sitz bath to help with discomfort. This is a warm water bath  that is taken while you are sitting down. The water should only come up to your hips and should cover your buttocks. Do this 3-4 times per day or as told by your health care provider.  Do not douche.  Wear breathable, cotton underwear.  If you have diabetes, keep your blood sugar levels under control. Contact a health care provider if:  You have a fever.  Your symptoms go away and then return.  Your symptoms do not get better with treatment.  Your symptoms get worse.  You have new symptoms.  You develop blisters in or around your vagina.  You have blood coming from your vagina and it is not your menstrual period.  You develop pain in your abdomen. This information is not intended to replace advice given to you by your health care provider. Make sure you discuss any questions you have with your health care provider. Document Released: 09/21/2005 Document Revised: 05/25/2016 Document Reviewed: 06/15/2015 Elsevier Interactive Patient Education  2017 [employer].

## 2017-01-16 NOTE — Progress Notes (Signed)
[name] is an 61 y.o. female for preop exam. Patients history as follows:  AS a result of abdominal bloating patient had an u/s which demonstrated an echogenic focus in the uterus so on 12/30/2016 patient had a sonohysterogram which demonstrated the following:  Ultrasound: Uterus measured 5.4 x 3.8 x 2.7 mm. An echogenic focus measuring 4 x 8 mm was noted no change from several years ago. Right and left ovary were normal. The cervix then cleansed with Betadine solution and a sterile catheter was introduced into the uterine cavity and the little defect in the fundus of the uterus measured 8 x 4 mm was noted. Following this a sterile Pipelle was introduced into the uterine cavity and required several passages in an effort to obtain tissue to submit for histological evaluation.  Patient last year was treated for BV and today questional odor today. She has used the Estring for vasomotor symptoms.   Pertinent Gynecological History: Menses: post-menopausal Bleeding: one day only of vaginal spotting after sonohysterogram Contraception: post menopausal status DES exposure: unknown Blood transfusions: none Sexually transmitted diseases: no past history Previous GYN Procedures: DNC , 2 csect Last mammogram: normal Date: 2017 Last pap: normal Date: 2015 OB History: G2, P2   Menstrual History: Menarche age: 6 No LMP recorded. Patient is postmenopausal.    Past Medical History:  Diagnosis Date  . Acid reflux   . Breast fibrocystic disorder   . Colon polyps    Benign  . Deviated septum   . Endometrial polyp   . Hypertension   . Osteoporosis     Past Surgical History:  Procedure Laterality Date  . CESAREAN SECTION     X 2  . COLONOSCOPY    . DILATION AND CURETTAGE OF UTERUS  2005  . HYSTEROSCOPY  2005  . NASAL SEPTUM SURGERY    . NISSEN FUNDOPLICATION    . ORIF CALCANEOUS FRACTURE Left 01/15/2016   Procedure: OPEN REDUCTION INTERNAL FIXATION (ORIF) LEFT CALCANEOUS  FRACTURE;  Surgeon: Marybelle Killings, MD;  Location: [facility];  Service: Orthopedics;  Laterality: Left;  . TONSILLECTOMY      Family History  Problem Relation Age of Onset  . Diabetes Father   . Hypertension Father   . Heart disease Father   . Diabetes Paternal Grandfather   . Heart disease Paternal Grandfather   . Diabetes Paternal Uncle     Social History:  reports that she is a non-smoker but has been exposed to tobacco smoke. She has never used smokeless tobacco. She reports that she drinks about 6.0 oz of alcohol per week . She reports that she does not use drugs.  Allergies:  Allergies  Allergen Reactions  . Prednisone Other (See Comments)    Mental status change and vaginal bleed  . Other Itching    Oxycodone/Hydrocodone causes itching     (Not in a hospital admission)  REVIEW OF SYSTEMS: A ROS was performed and pertinent positives and negatives are included in the history.  GENERAL: No fevers or chills. HEENT: No change in vision, no earache, sore throat or sinus congestion. NECK: No pain or stiffness. CARDIOVASCULAR: No chest pain or pressure. No palpitations. PULMONARY: No shortness of breath, cough or wheeze. GASTROINTESTINAL: No abdominal pain, nausea, vomiting or diarrhea, melena or bright red blood per rectum. GENITOURINARY: No urinary frequency, urgency, hesitancy or dysuria. MUSCULOSKELETAL: No joint or muscle pain, no back pain, no recent trauma. DERMATOLOGIC: No rash, no itching, no lesions. ENDOCRINE: No polyuria, polydipsia, no  heat or cold intolerance. No recent change in weight. HEMATOLOGICAL: No anemia or easy bruising or bleeding. NEUROLOGIC: No headache, seizures, numbness, tingling or weakness. PSYCHIATRIC: No depression, no loss of interest in normal activity or change in sleep pattern.     Blood pressure 136/88.  Physical Exam:  HEENT:unremarkable Neck:Supple, midline, no thyroid megaly, no carotid bruits Lungs:  Clear to auscultation no rhonchi's or  wheezes Heart:Regular rate and rhythm, no murmurs or gallops Breast Exam:Symmetrical in appearance no palpable masses or tenderness no supra clavicular axillary lymphadenopathy Abdomen: Soft nontender no rebound or guarding Pelvic:BUS within normal limits Vagina: Atrophic changes Cervix: No lesions atrophic changes Uterus: Anteverted nontender Adnexa: No palpable masses or tenderness Extremities: No cords, no edema Rectal: Deferred  Patient had been treated for suspected yeast infection last month a wet prep was done today and few yeast were noted few white blood cells and few bacteria. Patient will be prescribed Diflucan 150 mg one by mouth today and Tindamax 500 mg tablets she is to take 4 tablets tomorrow and repeat in 24 hours   Assessment/Plan: Schedule for resectoscopic polypectomy again of the week risks benefits and pros and cons of surgery that were discussed as follows:                        Patient was counseled as to the risk of surgery to include the following:  1. Infection (prohylactic antibiotics will be administered)  2. DVT/Pulmonary Embolism (prophylactic pneumo compression stockings will be used)  3.Trauma to internal organs requiring additional surgical procedure to repair any injury to     Internal organs requiring perhaps additional hospitalization days.  4.Hemmorhage requiring transfusion and blood products which carry risks such as             anaphylactic reaction, hepatitis and AIDS  Patient had received literature information on the procedure scheduled and all her questions were answered and fully accepts all risk.   [facility] HMD1:16 PMTD     Terrance Mass 01/16/2017, 12:27 PM

## 2017-01-17 ENCOUNTER — Encounter (HOSPITAL_BASED_OUTPATIENT_CLINIC_OR_DEPARTMENT_OTHER): Payer: Self-pay | Admitting: *Deleted

## 2017-01-18 ENCOUNTER — Encounter (HOSPITAL_BASED_OUTPATIENT_CLINIC_OR_DEPARTMENT_OTHER): Payer: Self-pay | Admitting: Anesthesiology

## 2017-01-18 NOTE — Anesthesia Preprocedure Evaluation (Addendum)
Anesthesia Evaluation  Patient identified by MRN, date of birth, ID band Patient awake    Reviewed: Allergy & Precautions, NPO status , Patient's Chart, lab work & pertinent test results  Airway Mallampati: I  TM Distance: >3 FB Neck ROM: Full    Dental no notable dental hx. (+) Teeth Intact   Pulmonary neg pulmonary ROS,    Pulmonary exam normal breath sounds clear to auscultation       Cardiovascular hypertension, Pt. on medications Normal cardiovascular exam Rhythm:Regular Rate:Normal     Neuro/Psych Anxiety negative neurological ROS     GI/Hepatic Neg liver ROS, hiatal hernia, GERD  Medicated and Controlled,  Endo/Other  negative endocrine ROS  Renal/GU negative Renal ROS  negative genitourinary   Musculoskeletal  (+) Arthritis , Osteoarthritis,  Osteoporosis   Abdominal   Peds  Hematology negative hematology ROS (+)   Anesthesia Other Findings   Reproductive/Obstetrics Endometrial polyp                            Lab Results  Component Value Date   WBC 7.1 01/19/2017   HGB 14.2 01/19/2017   HCT 42.3 01/19/2017   MCV 88.1 01/19/2017   PLT 222 01/19/2017     Chemistry      Component Value Date/Time   NA 137 01/19/2017 1206   K 4.0 01/19/2017 1206   CL 103 01/19/2017 1206   CO2 26 01/19/2017 1206   BUN 11 01/19/2017 1206   CREATININE 0.76 01/19/2017 1206   CREATININE 0.65 12/12/2013 1218      Component Value Date/Time   CALCIUM 9.5 01/19/2017 1206   CALCIUM 9.8 10/20/2011 1055   ALKPHOS 37 (L) 01/15/2016 0911   AST 33 01/15/2016 0911   ALT 22 01/15/2016 0911   BILITOT 0.5 01/15/2016 0911     EKG: normal EKG, normal sinus rhythm.  Anesthesia Physical Anesthesia Plan  ASA: II  Anesthesia Plan: General   Post-op Pain Management:    Induction: Intravenous  Airway Management Planned: LMA  Additional Equipment:   Intra-op Plan:   Post-operative Plan:  Extubation in OR  Informed Consent: I have reviewed the patients History and Physical, chart, labs and discussed the procedure including the risks, benefits and alternatives for the proposed anesthesia with the patient or authorized representative who has indicated his/her understanding and acceptance.     Plan Discussed with: CRNA, Anesthesiologist and Surgeon  Anesthesia Plan Comments:        Anesthesia Quick Evaluation

## 2017-01-19 ENCOUNTER — Other Ambulatory Visit (HOSPITAL_COMMUNITY)
Admission: RE | Admit: 2017-01-19 | Discharge: 2017-01-19 | Disposition: A | Discharge status: Home / Self Care | Payer: BC Managed Care – PPO | Source: Ambulatory Visit | Attending: Gynecology | Admitting: Gynecology

## 2017-01-19 ENCOUNTER — Encounter (HOSPITAL_BASED_OUTPATIENT_CLINIC_OR_DEPARTMENT_OTHER): Payer: Self-pay | Admitting: *Deleted

## 2017-01-19 DIAGNOSIS — Z01812 Encounter for preprocedural laboratory examination: Secondary | ICD-10-CM | POA: Insufficient documentation

## 2017-01-19 DIAGNOSIS — N859 Noninflammatory disorder of uterus, unspecified: Secondary | ICD-10-CM | POA: Diagnosis not present

## 2017-01-19 LAB — CBC
HCT: 42.3 % (ref 36.0–46.0)
Hemoglobin: 14.2 g/dL (ref 12.0–15.0)
MCH: 29.6 pg (ref 26.0–34.0)
MCHC: 33.6 g/dL (ref 30.0–36.0)
MCV: 88.1 fL (ref 78.0–100.0)
PLATELETS: 222 10*3/uL (ref 150–400)
RBC: 4.8 MIL/uL (ref 3.87–5.11)
RDW: 13.6 % (ref 11.5–15.5)
WBC: 7.1 10*3/uL (ref 4.0–10.5)

## 2017-01-19 LAB — BASIC METABOLIC PANEL
Anion gap: 8 (ref 5–15)
BUN: 11 mg/dL (ref 6–20)
CO2: 26 mmol/L (ref 22–32)
CREATININE: 0.76 mg/dL (ref 0.44–1.00)
Calcium: 9.5 mg/dL (ref 8.9–10.3)
Chloride: 103 mmol/L (ref 101–111)
GFR calc Af Amer: >60 mL/min (ref >60)
Glucose, Bld: 93 mg/dL (ref 65–99)
POTASSIUM: 4 mmol/L (ref 3.5–5.1)
Sodium: 137 mmol/L (ref 135–145)

## 2017-01-19 NOTE — Progress Notes (Addendum)
NPO AFTER MN.  ARRIVE AT 0600.  NEEDS EKG.  GETTING  LAB WORK DONE AT [facility] TODAY (CBC, BMET) .  FAXED ORDER REQUISITION'S TO [facility], AND SPOKE W/ TONYA AT [facility] VIA PHONE.

## 2017-01-20 ENCOUNTER — Ambulatory Visit (HOSPITAL_BASED_OUTPATIENT_CLINIC_OR_DEPARTMENT_OTHER)
Admission: RE | Admit: 2017-01-20 | Discharge: 2017-01-20 | Disposition: A | Discharge status: Home / Self Care | Payer: BC Managed Care – PPO | Source: Ambulatory Visit | Attending: Gynecology | Admitting: Gynecology

## 2017-01-20 ENCOUNTER — Ambulatory Visit (HOSPITAL_BASED_OUTPATIENT_CLINIC_OR_DEPARTMENT_OTHER): Payer: BC Managed Care – PPO | Admitting: Anesthesiology

## 2017-01-20 ENCOUNTER — Encounter (HOSPITAL_BASED_OUTPATIENT_CLINIC_OR_DEPARTMENT_OTHER)
Admission: RE | Disposition: A | Discharge status: Home / Self Care | Payer: Self-pay | Source: Ambulatory Visit | Attending: Gynecology

## 2017-01-20 ENCOUNTER — Encounter (HOSPITAL_BASED_OUTPATIENT_CLINIC_OR_DEPARTMENT_OTHER): Payer: Self-pay | Admitting: *Deleted

## 2017-01-20 ENCOUNTER — Other Ambulatory Visit: Payer: Self-pay

## 2017-01-20 DIAGNOSIS — F419 Anxiety disorder, unspecified: Secondary | ICD-10-CM | POA: Diagnosis not present

## 2017-01-20 DIAGNOSIS — N840 Polyp of corpus uteri: Secondary | ICD-10-CM | POA: Insufficient documentation

## 2017-01-20 DIAGNOSIS — M1990 Unspecified osteoarthritis, unspecified site: Secondary | ICD-10-CM | POA: Insufficient documentation

## 2017-01-20 DIAGNOSIS — I10 Essential (primary) hypertension: Secondary | ICD-10-CM | POA: Diagnosis not present

## 2017-01-20 DIAGNOSIS — K219 Gastro-esophageal reflux disease without esophagitis: Secondary | ICD-10-CM | POA: Insufficient documentation

## 2017-01-20 DIAGNOSIS — D250 Submucous leiomyoma of uterus: Secondary | ICD-10-CM | POA: Insufficient documentation

## 2017-01-20 DIAGNOSIS — M810 Age-related osteoporosis without current pathological fracture: Secondary | ICD-10-CM | POA: Diagnosis not present

## 2017-01-20 DIAGNOSIS — Z79899 Other long term (current) drug therapy: Secondary | ICD-10-CM | POA: Diagnosis not present

## 2017-01-20 DIAGNOSIS — K449 Diaphragmatic hernia without obstruction or gangrene: Secondary | ICD-10-CM | POA: Diagnosis not present

## 2017-01-20 HISTORY — DX: Gastro-esophageal reflux disease without esophagitis: K21.9

## 2017-01-20 HISTORY — DX: Preglaucoma, unspecified, bilateral: H40.003

## 2017-01-20 HISTORY — DX: Preglaucoma, unspecified, unspecified eye: H40.009

## 2017-01-20 HISTORY — DX: Personal history of other diseases of the digestive system: Z87.19

## 2017-01-20 HISTORY — DX: Unspecified osteoarthritis, unspecified site: M19.90

## 2017-01-20 HISTORY — DX: Personal history of adenomatous and serrated colon polyps: Z86.0101

## 2017-01-20 HISTORY — DX: Nodules of vocal cords: J38.2

## 2017-01-20 HISTORY — PX: DILATATION & CURETTAGE/HYSTEROSCOPY WITH MYOSURE: SHX6511

## 2017-01-20 HISTORY — DX: Personal history of colonic polyps: Z86.010

## 2017-01-20 SURGERY — DILATATION & CURETTAGE/HYSTEROSCOPY WITH MYOSURE
Anesthesia: General | Site: Vagina

## 2017-01-20 MED ORDER — PROPOFOL 10 MG/ML IV BOLUS
INTRAVENOUS | Status: AC
  Filled 2017-01-20: qty 40

## 2017-01-20 MED ORDER — MIDAZOLAM HCL 5 MG/5ML IJ SOLN
INTRAMUSCULAR | Status: DC | PRN
  Administered 2017-01-20: 2 mg via INTRAVENOUS

## 2017-01-20 MED ORDER — FENTANYL CITRATE (PF) 100 MCG/2ML IJ SOLN
25.0000 ug | INTRAMUSCULAR | Status: DC | PRN
  Filled 2017-01-20: qty 1

## 2017-01-20 MED ORDER — DEXAMETHASONE SODIUM PHOSPHATE 10 MG/ML IJ SOLN
INTRAMUSCULAR | Status: AC
  Filled 2017-01-20: qty 1

## 2017-01-20 MED ORDER — ONDANSETRON HCL 4 MG/2ML IJ SOLN
INTRAMUSCULAR | Status: AC
  Filled 2017-01-20: qty 2

## 2017-01-20 MED ORDER — HYDROCODONE-ACETAMINOPHEN 7.5-325 MG PO TABS
1.0000 | ORAL_TABLET | Freq: Once | ORAL | Status: DC | PRN
  Filled 2017-01-20: qty 1

## 2017-01-20 MED ORDER — PROPOFOL 10 MG/ML IV BOLUS
INTRAVENOUS | Status: DC | PRN
  Administered 2017-01-20: 100 mg via INTRAVENOUS
  Administered 2017-01-20: 200 mg via INTRAVENOUS

## 2017-01-20 MED ORDER — ONDANSETRON HCL 4 MG/2ML IJ SOLN
INTRAMUSCULAR | Status: DC | PRN
  Administered 2017-01-20: 4 mg via INTRAVENOUS

## 2017-01-20 MED ORDER — KETOROLAC TROMETHAMINE 30 MG/ML IJ SOLN
INTRAMUSCULAR | Status: AC
  Filled 2017-01-20: qty 1

## 2017-01-20 MED ORDER — FENTANYL CITRATE (PF) 100 MCG/2ML IJ SOLN
INTRAMUSCULAR | Status: DC | PRN
Start: 2017-01-20 — End: 2017-01-20
  Administered 2017-01-20 (×4): 25 ug via INTRAVENOUS

## 2017-01-20 MED ORDER — LIDOCAINE-EPINEPHRINE 1 %-1:100000 IJ SOLN
INTRAMUSCULAR | Status: DC | PRN
Start: 2017-01-20 — End: 2017-01-20
  Administered 2017-01-20: 10 mL

## 2017-01-20 MED ORDER — LACTATED RINGERS IV SOLN
INTRAVENOUS | Status: DC
  Administered 2017-01-20: 07:00:00 via INTRAVENOUS
  Filled 2017-01-20: qty 1000

## 2017-01-20 MED ORDER — LIDOCAINE 2% (20 MG/ML) 5 ML SYRINGE
INTRAMUSCULAR | Status: AC
  Filled 2017-01-20: qty 5

## 2017-01-20 MED ORDER — MIDAZOLAM HCL 2 MG/2ML IJ SOLN
INTRAMUSCULAR | Status: AC
  Filled 2017-01-20: qty 2

## 2017-01-20 MED ORDER — CEFOTETAN DISODIUM-DEXTROSE 2-2.08 GM-% IV SOLR
INTRAVENOUS | Status: AC
  Filled 2017-01-20: qty 50

## 2017-01-20 MED ORDER — DEXTROSE 5 % IV SOLN
2.0000 g | INTRAVENOUS | Status: AC
  Administered 2017-01-20: 2 g via INTRAVENOUS
  Filled 2017-01-20: qty 2

## 2017-01-20 MED ORDER — MEPERIDINE HCL 25 MG/ML IJ SOLN
6.2500 mg | INTRAMUSCULAR | Status: DC | PRN
  Filled 2017-01-20: qty 1

## 2017-01-20 MED ORDER — KETOROLAC TROMETHAMINE 30 MG/ML IJ SOLN
INTRAMUSCULAR | Status: DC | PRN
  Administered 2017-01-20: 30 mg via INTRAVENOUS

## 2017-01-20 MED ORDER — FENTANYL CITRATE (PF) 100 MCG/2ML IJ SOLN
INTRAMUSCULAR | Status: AC
  Filled 2017-01-20: qty 2

## 2017-01-20 MED ORDER — METOCLOPRAMIDE HCL 5 MG/ML IJ SOLN
10.0000 mg | Freq: Once | INTRAMUSCULAR | Status: DC | PRN
  Filled 2017-01-20: qty 2

## 2017-01-20 MED ORDER — LIDOCAINE HCL (CARDIAC) 20 MG/ML IV SOLN
INTRAVENOUS | Status: DC | PRN
  Administered 2017-01-20: 80 mg via INTRAVENOUS

## 2017-01-20 SURGICAL SUPPLY — 34 items
CANISTER SUCTION 2500CC (MISCELLANEOUS) ×3 IMPLANT
CATH FOLEY 2WAY SLVR 30CC 16FR (CATHETERS) IMPLANT
CATH ROBINSON RED A/P 16FR (CATHETERS) ×3 IMPLANT
CONTAINER PREFILL 10% NBF 60ML (FORM) ×6 IMPLANT
COVER BACK TABLE 60X90IN (DRAPES) ×3 IMPLANT
DEVICE MYOSURE LITE (MISCELLANEOUS) ×3 IMPLANT
DEVICE MYOSURE REACH (MISCELLANEOUS) IMPLANT
DRAPE HYSTEROSCOPY (DRAPE) ×3 IMPLANT
DRAPE LG THREE QUARTER DISP (DRAPES) ×3 IMPLANT
DRSG TELFA 3X8 NADH (GAUZE/BANDAGES/DRESSINGS) IMPLANT
ELECT REM PT RETURN 9FT ADLT (ELECTROSURGICAL)
ELECTRODE REM PT RTRN 9FT ADLT (ELECTROSURGICAL) IMPLANT
FILTER ARTHROSCOPY CONVERTOR (FILTER) ×3 IMPLANT
GLOVE BIOGEL PI IND STRL 7.5 (GLOVE) ×3 IMPLANT
GLOVE BIOGEL PI IND STRL 8 (GLOVE) ×1 IMPLANT
GLOVE BIOGEL PI INDICATOR 7.5 (GLOVE) ×6
GLOVE BIOGEL PI INDICATOR 8 (GLOVE) ×2
GLOVE ECLIPSE 7.5 STRL STRAW (GLOVE) ×3 IMPLANT
GOWN STRL REUS W/TWL LRG LVL3 (GOWN DISPOSABLE) ×3 IMPLANT
GOWN STRL REUS W/TWL XL LVL3 (GOWN DISPOSABLE) ×3 IMPLANT
LEGGING LITHOTOMY PAIR STRL (DRAPES) ×3 IMPLANT
PACK BASIN DAY SURGERY FS (CUSTOM PROCEDURE TRAY) ×3 IMPLANT
PAD OB MATERNITY 4.3X12.25 (PERSONAL CARE ITEMS) ×3 IMPLANT
PAD PREP 24X48 CUFFED NSTRL (MISCELLANEOUS) ×3 IMPLANT
PLUG CATH AND CAP STER (CATHETERS) IMPLANT
SEAL ROD LENS SCOPE MYOSURE (ABLATOR) ×3 IMPLANT
SOL PREP PROV IODINE SCRUB 4OZ (MISCELLANEOUS) ×3 IMPLANT
SYR 30ML LL (SYRINGE) IMPLANT
SYR CONTROL 10ML LL (SYRINGE) ×3 IMPLANT
TOWEL OR 17X24 6PK STRL BLUE (TOWEL DISPOSABLE) ×6 IMPLANT
TRAY DSU PREP LF (CUSTOM PROCEDURE TRAY) ×3 IMPLANT
TUBING AQUILEX INFLOW (TUBING) ×3 IMPLANT
TUBING AQUILEX OUTFLOW (TUBING) ×3 IMPLANT
WATER STERILE IRR 500ML POUR (IV SOLUTION) ×3 IMPLANT

## 2017-01-20 NOTE — Anesthesia Procedure Notes (Signed)
Procedure Name: LMA Insertion Date/Time: 01/20/2017 7:38 AM Performed by: Justice Rocher Pre-anesthesia Checklist: Patient identified, Emergency Drugs available, Suction available and Patient being monitored Patient Re-evaluated:Patient Re-evaluated prior to inductionOxygen Delivery Method: Circle system utilized Preoxygenation: Pre-oxygenation with 100% oxygen Intubation Type: IV induction Ventilation: Mask ventilation without difficulty LMA: LMA inserted LMA Size: 4.0 Number of attempts: 1 Airway Equipment and Method: Bite block Placement Confirmation: positive ETCO2 Tube secured with: Tape Dental Injury: Teeth and Oropharynx as per pre-operative assessment

## 2017-01-20 NOTE — Discharge Instructions (Signed)

## 2017-01-20 NOTE — Anesthesia Postprocedure Evaluation (Signed)
Anesthesia Post Note  Patient: [name]  Procedure(s) Performed: Procedure(s) (LRB): DILATATION & CURETTAGE/HYSTEROSCOPY WITH MYOSURE (N/A)  Patient location during evaluation: PACU Anesthesia Type: General Level of consciousness: awake and alert and oriented Pain management: pain level controlled Vital Signs Assessment: post-procedure vital signs reviewed and stable Respiratory status: spontaneous breathing, nonlabored ventilation and respiratory function stable Cardiovascular status: blood pressure returned to baseline and stable Postop Assessment: no signs of nausea or vomiting Anesthetic complications: no       Last Vitals:  Vitals:   01/20/17 0815 01/20/17 0830  BP: (!) 111/52 (!) 115/59  Pulse: 96 (!) 103  Resp: 13 17  Temp:      Last Pain:  Vitals:   01/20/17 0811  TempSrc:   PainSc: 0-No pain                 Danniell Rotundo A.

## 2017-01-20 NOTE — Op Note (Signed)
   Operative Note  01/20/2017  8:15 AM  PATIENT:  [name]  61 y.o. 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 Both tubal ostia were identified no other intracavitary defect were noted. Endocervical canal was smooth and normal.  DESCRIPTION OF OPERATION:FINDINGS: The patient was taken to the operating room where she underwent a successful general endotracheal anesthesia. Patient had PAS stockings for DVT prophylaxis. She received 2 g of Cefotan preop. Time out was undertaken to properly identify the patient and the proper operation schedule. The vagina and perineum were prepped and draped in usual sterile fashion. A red rubber Quentin Cornwall was inserted to empty the bladder is content for approximately 10 cc. Bimanual examination demonstrated an anteverted uterus. Patient's legs were in the high lithotomy position. A weighted speculum was placed in the posterior vaginal vault. A single-tooth tenaculum was placed on the anterior cervical lip. 1% lidocaine 1 100,000 epinephrine dilution was injected into the cervical stroma for paracervical block at the 2, 4, 8, and 10:00 position for a total 10 cc The uterus sounded to 7-1/2 cm. Pratt dilator were used to dilate the cervical canal to a 21 mm size. The Hologic Myosure resectoscopic morcellator with a scope of 6.25 mm and an operating blade of 3.0 mm was introduced into the intrauterine cavity. 0.9% normal saline was the distending media. Inspection of the endometrial cavity demonstrated   Midportion of uterus posterior lateral wall lesion highly suspicious for submucous myoma  instead of endometrial polyp. Both tubal ostia were identified no other intracavitary defect were noted. Endocervical canal was smooth and normal.  The lesion was resected completely and submitted for histological evaluation Fluid deficit was approximately 200 cc.  The single-tooth tenaculum was removed patient was extubated transferred to recovery room stable vital signs blood loss was minimal. She received 30 mg of Toradol in route to the recovery room.    ESTIMATED BLOOD LOSS: Minimal   Intake/Output Summary (Last 24 hours) at 01/20/17 0815 Last data filed at 01/20/17 [id number]  Gross per 24 hour  Intake              750 ml  Output                2 ml  Net              748 ml     BLOOD ADMINISTERED:none   LOCAL MEDICATIONS USED:  LIDOCAINE 1% with 1 100,000 epinephrine total 10 cc injected for cervical block  SPECIMEN:  Source of Specimen:  Uterine lesion possible submucous myoma versus polyp  DISPOSITION OF SPECIMEN:  PATHOLOGY  COUNTS:  YES  PLAN OF CARE: Transfer to PACU  [facility] HMD8:15 AMTD@

## 2017-01-20 NOTE — Interval H&P Note (Signed)
History and Physical Interval Note:  01/20/2017 7:19 AM  [name]  has presented today for surgery, with the diagnosis of uterine lesion possible endometrial polyp   The various methods of treatment have been discussed with the patient and family. After consideration of risks, benefits and other options for treatment, the patient has consented to  Procedure(s) with comments: [place] Level (N/A) - request 7:30am OR time  Needs one hour OR time   as a surgical intervention .  The patient's history has been reviewed, patient examined, no change in status, stable for surgery.  I have reviewed the patient's chart and labs.  Questions were answered to the patient's satisfaction.     Terrance Mass

## 2017-01-20 NOTE — H&P (View-Only) (Signed)
  Patient is a 61 year old that was instructed to come to the office today for sonohysterogram due to the fact and ultrasound had recently been ordered because patient was complaining of low abdominal bloating at which time the ultrasound had demonstrated an echogenic focus in the endometrial cavity and she's here for sonohysterogram and endometrial biopsy.  Ultrasound: Uterus measured 5.4 x 3.8 x 2.7 mm. An echogenic focus measuring 4 x 8 mm was noted no change from several years ago. Right and left ovary were normal. The cervix then cleansed with Betadine solution and a sterile catheter was introduced into the uterine cavity and the little defect in the fundus of the uterus measured 8 x 4 mm was noted. Following this a sterile Pipelle was introduced into the uterine cavity and required several passages in an effort to obtain tissue to submit for histological evaluation.  Patient early this year had been placed on Estring for vasomotor symptoms and she had forgotten to remove it was recently removed and she was treated for potential BV. Patient reports no vaginal bleeding.  Assessment/plan: 61 year old with appears to be a small endometrial polyp that hasn't changed in several years was given the option of treatment with high dose progesterone for 3 months and repeat sonohysterogram that she has opted to proceed with resectoscopic polypectomy which will be scheduled an outpatient procedure. Will notify her with the results of the pathology as soon as it becomes available. Literature information was provided.

## 2017-01-20 NOTE — Transfer of Care (Signed)
Immediate Anesthesia Transfer of Care Note  Patient: [name]  Procedure(s) Performed: Procedure(s) (LRB): DILATATION & CURETTAGE/HYSTEROSCOPY WITH MYOSURE (N/A)  Patient Location: PACU  Anesthesia Type: General  Level of Consciousness: awake, sedated, patient cooperative and responds to stimulation  Airway & Oxygen Therapy: Patient Spontanous Breathing and Patient connected to [place] oxygen  Post-op Assessment: Report given to PACU RN, Post -op Vital signs reviewed and stable and Patient moving all extremities  Post vital signs: Reviewed and stable  Complications: No apparent anesthesia complications

## 2017-01-23 ENCOUNTER — Encounter (HOSPITAL_BASED_OUTPATIENT_CLINIC_OR_DEPARTMENT_OTHER): Payer: Self-pay | Admitting: Gynecology

## 2017-01-30 ENCOUNTER — Ambulatory Visit: Payer: BC Managed Care – PPO | Admitting: Gynecology

## 2017-02-01 ENCOUNTER — Ambulatory Visit: Payer: BC Managed Care – PPO | Admitting: Gynecology

## 2017-02-03 ENCOUNTER — Ambulatory Visit (INDEPENDENT_AMBULATORY_CARE_PROVIDER_SITE_OTHER): Payer: BC Managed Care – PPO | Admitting: Gynecology

## 2017-02-03 ENCOUNTER — Encounter (HOSPITAL_BASED_OUTPATIENT_CLINIC_OR_DEPARTMENT_OTHER): Payer: Self-pay | Admitting: Gynecology

## 2017-02-03 VITALS — BP 142/82 | Ht 65.0 in | Wt 144.0 lb

## 2017-02-03 DIAGNOSIS — M810 Age-related osteoporosis without current pathological fracture: Secondary | ICD-10-CM

## 2017-02-03 DIAGNOSIS — Z09 Encounter for follow-up examination after completed treatment for conditions other than malignant neoplasm: Secondary | ICD-10-CM

## 2017-02-03 NOTE — Patient Instructions (Signed)

## 2017-02-03 NOTE — Progress Notes (Signed)
   Patient is a 61 year old that presented to the office today for 2 weeks postop visit. Patient is status post resectoscopic myomectomy and polypectomy which was performed on 01/20/2017. Patient is doing well with no complaints today. Findings from surgery to include pictures and pathology report were shared with the patient as follows:  FINDINGS: Midportion of uterus posterior lateral wall lesion highly suspicious for submucous myoma instead of endometrial polyp. Both tubal ostia were identified no other intracavitary defect were noted. Endocervical canal was smooth and normal.  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 Patient's most recent bone density study was in 2016 which demonstrated the lowest T score was -2.6 at the left femoral neck An when compared with 2014 had a statistically improvement of the AP spine and left femoral neck and no change on the right femoral neck. Since initiating the Prolia she has responded well.   Exam: Abdomen: Soft nontender no rebound or guarding Pelvic: Bartholin urethra Skene was within normal limits Vagina: Atrophic changes Cervix: No lesions or discharge Uterus: Anteverted normal size shape and consistency Adnexa: No palpable mass or tenderness Rectal vaginal exam not done  Assessment/plan: Patient status post resectoscopic polypectomy had resectoscopic myomectomy benign pathology doing well. Patient with history of osteoporosis had been on Prolia for 4  years currently on drug holiday. Patient to schedule bone density study in the next few weeks. Patient otherwise scheduled to return to the office in one year for annual exam or when necessary. It was once again stressed upon her the importance of calcium vitamin D and weightbearing exercises for osteoporosis prevention. Patient had a normal vitamin D level in July 2017.

## 2017-02-08 ENCOUNTER — Telehealth: Payer: Self-pay | Admitting: *Deleted

## 2017-02-08 NOTE — Telephone Encounter (Signed)
Pt had post-op appointment 02/03/17 and forgot to ask to questions:  1. Is her lifting restrictions over?   2. Okay to have sex now, will be leaving for vacation.   Please advise

## 2017-02-08 NOTE — Telephone Encounter (Signed)
Pt informed with the below note. 

## 2017-02-08 NOTE — Telephone Encounter (Signed)
Tell her she has no restrictions. Tell her we wish her a great vacation.

## 2017-03-07 ENCOUNTER — Ambulatory Visit (INDEPENDENT_AMBULATORY_CARE_PROVIDER_SITE_OTHER): Payer: BC Managed Care – PPO

## 2017-03-07 ENCOUNTER — Other Ambulatory Visit: Payer: Self-pay | Admitting: Gynecology

## 2017-03-07 DIAGNOSIS — Z87311 Personal history of (healed) other pathological fracture: Secondary | ICD-10-CM

## 2017-03-07 DIAGNOSIS — M810 Age-related osteoporosis without current pathological fracture: Secondary | ICD-10-CM

## 2017-03-13 ENCOUNTER — Other Ambulatory Visit: Payer: Self-pay | Admitting: *Deleted

## 2017-03-13 DIAGNOSIS — M818 Other osteoporosis without current pathological fracture: Secondary | ICD-10-CM

## 2017-03-14 ENCOUNTER — Other Ambulatory Visit: Payer: BC Managed Care – PPO

## 2017-03-14 DIAGNOSIS — M818 Other osteoporosis without current pathological fracture: Secondary | ICD-10-CM

## 2017-03-15 ENCOUNTER — Other Ambulatory Visit: Payer: BC Managed Care – PPO

## 2017-03-16 LAB — VITAMIN D 1,25 DIHYDROXY
VITAMIN D 1, 25 (OH) TOTAL: 43 pg/mL (ref 18–72)
Vitamin D3 1, 25 (OH)2: 43 pg/mL

## 2017-05-03 ENCOUNTER — Telehealth: Payer: Self-pay | Admitting: Gynecology

## 2017-05-03 ENCOUNTER — Encounter: Payer: Self-pay | Admitting: Gynecology

## 2017-05-03 ENCOUNTER — Ambulatory Visit (INDEPENDENT_AMBULATORY_CARE_PROVIDER_SITE_OTHER): Payer: BC Managed Care – PPO | Admitting: Gynecology

## 2017-05-03 VITALS — BP 136/92

## 2017-05-03 DIAGNOSIS — M810 Age-related osteoporosis without current pathological fracture: Secondary | ICD-10-CM | POA: Diagnosis not present

## 2017-05-03 NOTE — Patient Instructions (Signed)
Osteoporosis Osteoporosis is the thinning and loss of density in the bones. Osteoporosis makes the bones more brittle, fragile, and likely to break (fracture). Over time, osteoporosis can cause the bones to become so weak that they fracture after a simple fall. The bones most likely to fracture are the bones in the hip, wrist, and spine. What are the causes? The exact cause is not known. What increases the risk? Anyone can develop osteoporosis. You may be at greater risk if you have a family history of the condition or have poor nutrition. You may also have a higher risk if you are:  Female.  50 years old or older.  A smoker.  Not physically active.  White or Asian.  Slender.  What are the signs or symptoms? A fracture might be the first sign of the disease, especially if it results from a fall or injury that would not usually cause a bone to break. Other signs and symptoms include:  Low back and neck pain.  Stooped posture.  Height loss.  How is this diagnosed? To make a diagnosis, your health care provider may:  Take a medical history.  Perform a physical exam.  Order tests, such as: ? A bone mineral density test. ? A dual-energy X-ray absorptiometry test.  How is this treated? The goal of osteoporosis treatment is to strengthen your bones to reduce your risk of a fracture. Treatment may involve:  Making lifestyle changes, such as: ? Eating a diet rich in calcium. ? Doing weight-bearing and muscle-strengthening exercises. ? Stopping tobacco use. ? Limiting alcohol intake.  Taking medicine to slow the process of bone loss or to increase bone density.  Monitoring your levels of calcium and vitamin D.  Follow these instructions at home:  Include calcium and vitamin D in your diet. Calcium is important for bone health, and vitamin D helps the body absorb calcium.  Perform weight-bearing and muscle-strengthening exercises as directed by your health care  provider.  Do not use any tobacco products, including cigarettes, chewing tobacco, and electronic cigarettes. If you need help quitting, ask your health care provider.  Limit your alcohol intake.  Take medicines only as directed by your health care provider.  Keep all follow-up visits as directed by your health care provider. This is important.  Take precautions at home to lower your risk of falling, such as: ? Keeping rooms well lit and clutter free. ? Installing safety rails on stairs. ? Using rubber mats in the bathroom and other areas that are often wet or slippery. Get help right away if: You fall or injure yourself. This information is not intended to replace advice given to you by your health care provider. Make sure you discuss any questions you have with your health care provider. Document Released: 09/21/2005 Document Revised: 05/16/2016 Document Reviewed: 05/22/2014 Elsevier Interactive Patient Education  2017 Elsevier Inc.  Denosumab injection What is this medicine? DENOSUMAB (den oh sue mab) slows bone breakdown. Prolia is used to treat osteoporosis in women after menopause and in men. Xgeva is used to treat a high calcium level due to cancer and to prevent bone fractures and other bone problems caused by multiple myeloma or cancer bone metastases. Xgeva is also used to treat giant cell tumor of the bone. This medicine may be used for other purposes; ask your health care provider or pharmacist if you have questions. COMMON BRAND NAME(S): Prolia, XGEVA What should I tell my health care provider before I take this medicine? They need to   of these conditions: -dental disease -having surgery or tooth extraction -infection -kidney disease -low levels of calcium or Vitamin D in the blood -malnutrition -on hemodialysis -skin conditions or sensitivity -thyroid or parathyroid disease -an unusual reaction to denosumab, other medicines, foods, dyes, or  preservatives -pregnant or trying to get pregnant -breast-feeding How should I use this medicine? This medicine is for injection under the skin. It is given by a health care professional in a hospital or clinic setting. If you are getting Prolia, a special MedGuide will be given to you by the pharmacist with each prescription and refill. Be sure to read this information carefully each time. For Prolia, talk to your pediatrician regarding the use of this medicine in children. Special care may be needed. For Delton See, talk to your pediatrician regarding the use of this medicine in children. While this drug may be prescribed for children as young as 13 years for selected conditions, precautions do apply. Overdosage: If you think you have taken too much of this medicine contact a poison control center or emergency room at once. NOTE: This medicine is only for you. Do not share this medicine with others. What if I miss a dose? It is important not to miss your dose. Call your doctor or health care professional if you are unable to keep an appointment. What may interact with this medicine? Do not take this medicine with any of the following medications: -other medicines containing denosumab This medicine may also interact with the following medications: -medicines that lower your chance of fighting infection -steroid medicines like prednisone or cortisone This list may not describe all possible interactions. Give your health care provider a list of all the medicines, herbs, non-prescription drugs, or dietary supplements you use. Also tell them if you smoke, drink alcohol, or use illegal drugs. Some items may interact with your medicine. What should I watch for while using this medicine? Visit your doctor or health care professional for regular checks on your progress. Your doctor or health care professional may order blood tests and other tests to see how you are doing. Call your doctor or health care  professional for advice if you get a fever, chills or sore throat, or other symptoms of a cold or flu. Do not treat yourself. This drug may decrease your body's ability to fight infection. Try to avoid being around people who are sick. You should make sure you get enough calcium and vitamin D while you are taking this medicine, unless your doctor tells you not to. Discuss the foods you eat and the vitamins you take with your health care professional. See your dentist regularly. Brush and floss your teeth as directed. Before you have any dental work done, tell your dentist you are receiving this medicine. Do not become pregnant while taking this medicine or for 5 months after stopping it. Talk with your doctor or health care professional about your birth control options while taking this medicine. Women should inform their doctor if they wish to become pregnant or think they might be pregnant. There is a potential for serious side effects to an unborn child. Talk to your health care professional or pharmacist for more information. What side effects may I notice from receiving this medicine? Side effects that you should report to your doctor or health care professional as soon as possible: -allergic reactions like skin rash, itching or hives, swelling of the face, lips, or tongue -bone pain -breathing problems -dizziness -jaw pain, especially after dental work -redness,  blistering, peeling of the skin -signs and symptoms of infection like fever or chills; cough; sore throat; pain or trouble passing urine -signs of low calcium like fast heartbeat, muscle cramps or muscle pain; pain, tingling, numbness in the hands or feet; seizures -unusual bleeding or bruising -unusually weak or tired Side effects that usually do not require medical attention (report to your doctor or health care professional if they continue or are bothersome): -constipation -diarrhea -headache -joint pain -loss of  appetite -muscle pain -runny nose -tiredness -upset stomach This list may not describe all possible side effects. Call your doctor for medical advice about side effects. You may report side effects to FDA at 1-800-FDA-1088. Where should I keep my medicine? This medicine is only given in a clinic, doctor's office, or other health care setting and will not be stored at home. NOTE: This sheet is a summary. It may not cover all possible information. If you have questions about this medicine, talk to your doctor, pharmacist, or health care provider.  2018 Elsevier/Gold Standard (2017-01-03 19:17:21)

## 2017-05-03 NOTE — Progress Notes (Signed)
Patient is a 61 year old that presented to the office today to discuss her bone density study. Patient with known history of osteoporosis and prior treatment history as follows:  Patient was started on Prolia in 2012 the following is a summary of her injections:  December 2012 June 2013 December 2013 July 2014 January 2015 did not receive injection September 2015 April 2016 October 2016 No injections in 2017 2018 currently on drug holiday  Patient had a calcaneus fracture in July 2017. Patient's most recent bone density study was in 2016 which demonstrated the lowest T score was -2.6 at the left femoral neck An when compared with 2014 had a statistically improvement of the AP spine and left femoral neck and no change on the right femoral neck. Since initiating the Prolia she had responded well.  Her recent vitamin D level here in the office was normal and she is currently taking calcium vitamin D and engages weightbearing exercises 3 times a week.  Bone density study 03/07/2017 indicated that her lowest T score was -2.9 with a statistically significant decrease in bone mineralization of -10.2% with compare with previous candidate 2016. Her total left hip had a statistically significant decrease of bone mineralization of 7.1% and total right hip no significant change.  These findings clearly indicate that patient's bone mass is 25% below normal and that she has an 8 times greater risk of her spinal fracture and 11 times greater risk of a hip fracture.   Patient had discontinued the Prolia after she fractured her calcaneus as had been recommended by orthopedics to allow time to "heal". It appears that her bone mineralization has decreased. I have recommended that she be restarted on Prolia 60 mg subcutaneous every 6 months for at least 3 more years since she has had interruption in her treatment as noted above. We will check her calcium level today. We discussed other alternatives for  treatment for osteoporosis as follows:  The risk and benefits of IV bisphosphonate were discussed with the patient today to include a significant risk reduction for vertebral, hip and non-vertebral fractures. Potential risk of therapy were also discussed to include a 10-15% chance for a flulike reaction which may last 2-3 days after IV bisphosphonate. We also discussed that longer reactions lasting perhaps weeks to months have been rarely reported to the FDA following IV bisphosphonate treatment. This would also require symptomatic management. Other potential risk that were discussed included the following: Regular eye irritation, approximately 1 and 1000-10,000 risk for osteonecrosis of the jaw as well as a risk for atypical femoral fracture of approximately 1 in 5000-10,000 based on current data on oral bisphosphonate. Signs and symptoms of atypical femoral fracture were also discussed and the patient was asked to contact the office if she develops any of these symptoms.  The risk and benefits of Forteo were discussed with the patient today to include a significant risk reduction for vertebral and nontender vertebral fractures. The most common side effects that have been reported include: Dizziness, and leg cramps although generally not severe enough to warrant discontinuation in the majority of patient on this medication. The black box warning was discussed in depth, which is based on the fact that supra-pharmacological doses of Forteo did increase the risk for benign and malignant bone tumors in rats, though there has not been an observed increase incidence in humans to date, based on over a decade of clinical experience. Patient does not have any relative contraindication to Forteo, including no history of  previous therapeutic radiation therapy, active neoplasms involving the skeleton or Paget's disease.  The risk and benefits of Prolia were discussed with the patient today to include a significant risk  reduction for vertebral, hip and non-vertebral fractures. Potential risk also discussed were skin advanced to include rash, pruritus, eczema and other skin reactions. The recurrence of infections and clinical studies with PROLIA, including serious infections were also discussed with the patient. In 3 year clinical trial with Prolia although six-year trial data does not show any evidence for any additional or accelerating risk of infection. Other risks that were discussed included a 1 in 1000-10,000 risk of osteonecrosis of the jaw based on current available data.  She has decided to proceed with Prolia once again for 3 more years. She'll return to the office in one year after initiating therapy once again to monitor response to treatment with a bone density study.  Greater than 90% of time was spent calcium coronary care for this patient with osteoporosis. Time of consultation 25 minutes

## 2017-05-03 NOTE — Telephone Encounter (Signed)
Patient with osteoporosis needs to restart Prolia after a two year drug holiday (by orthopedist not me) now has worsened osteoporosis. Labs in chart.

## 2017-05-10 ENCOUNTER — Encounter: Payer: Self-pay | Admitting: Gynecology

## 2017-06-01 NOTE — Telephone Encounter (Signed)
PC to pt Prolia needs PA explained to pt. Calcium PCP  9.5 01/19/17 . Complete exam 03/10/16 [facility] [place]  Dr Toney Rakes did Pre OP exam 01/16/17  Will check with Dr Toney Rakes about complete and repeat calcium.

## 2017-06-02 NOTE — Telephone Encounter (Signed)
She has a calcium level drawn about 6 months ago. She needs a calcium level drawn Her next annual exam with [name] will be in March

## 2017-06-06 NOTE — Telephone Encounter (Addendum)
Insurance benefits No Deductible. Co pay $85 with OV. 100% coverage no OV . Cost to pt $0. PA needed. Reference # [id number]  PA approved valid 06/09/17  Thru 12/25/17

## 2017-06-19 NOTE — Telephone Encounter (Signed)
Note to Dr Moshe Salisbury. Pt ?? Complete exam due to last complete 02/2016 with [place], then had pre op appointment for polyp in 12/2016 . She wants to come in for Prolia and does not want to have another complete exam unless you think she should before you leave . Please advise what to do >

## 2017-06-19 NOTE — Telephone Encounter (Signed)
She does not need one. She had one at Preop

## 2017-06-20 ENCOUNTER — Encounter: Payer: Self-pay | Admitting: Anesthesiology

## 2017-06-20 NOTE — Telephone Encounter (Signed)
LM on Vm to call and schedule Prolia injection

## 2017-06-27 NOTE — Telephone Encounter (Signed)
PC to pt she will call back and schedule her appointment for Prolia injection. Nurse only appointment.

## 2017-07-03 NOTE — Telephone Encounter (Signed)
Prolia July 18th scheduled by pt. Per DM

## 2017-07-12 ENCOUNTER — Ambulatory Visit (INDEPENDENT_AMBULATORY_CARE_PROVIDER_SITE_OTHER): Payer: BC Managed Care – PPO | Admitting: Gynecology

## 2017-07-12 DIAGNOSIS — M810 Age-related osteoporosis without current pathological fracture: Secondary | ICD-10-CM | POA: Diagnosis not present

## 2017-07-12 MED ORDER — DENOSUMAB 60 MG/ML ~~LOC~~ SOLN
60.0000 mg | Freq: Once | SUBCUTANEOUS | Status: AC
  Administered 2017-07-12: 60 mg via SUBCUTANEOUS

## 2017-07-17 NOTE — Telephone Encounter (Signed)
Prolia given 07/12/17  Next injection due 01/13/18

## 2017-12-07 ENCOUNTER — Other Ambulatory Visit: Payer: Self-pay | Admitting: Obstetrics and Gynecology

## 2017-12-07 DIAGNOSIS — Z1231 Encounter for screening mammogram for malignant neoplasm of breast: Secondary | ICD-10-CM

## 2017-12-25 ENCOUNTER — Telehealth: Payer: Self-pay | Admitting: Gynecology

## 2017-12-25 NOTE — Telephone Encounter (Signed)
prolia due 01/13/18 Complete with JF  No complete scheduled here, noted in Epic pt appears to have changed MD to Dr Gaetano Net at [facility]. We will call and confirm this.

## 2017-12-27 NOTE — Telephone Encounter (Signed)
PC to pt  Prolia due 01/13/18.Pt has changed providers to Dr Gertie Fey at [facility] and she will continue care there. She has requested her medical records from [facility]. She asked several questions due to recent surgery and I referred her to ask her new provider these questions.

## 2018-01-08 ENCOUNTER — Ambulatory Visit
Admission: RE | Admit: 2018-01-08 | Discharge: 2018-01-08 | Disposition: A | Discharge status: Home / Self Care | Payer: BC Managed Care – PPO | Source: Ambulatory Visit | Attending: Obstetrics and Gynecology | Admitting: Obstetrics and Gynecology

## 2018-01-08 DIAGNOSIS — Z1231 Encounter for screening mammogram for malignant neoplasm of breast: Secondary | ICD-10-CM

## 2018-10-23 ENCOUNTER — Encounter: Payer: Self-pay | Admitting: *Deleted

## 2018-10-24 ENCOUNTER — Encounter: Payer: Self-pay | Admitting: Cardiovascular Disease

## 2018-10-24 ENCOUNTER — Ambulatory Visit: Payer: BC Managed Care – PPO | Admitting: Cardiovascular Disease

## 2018-10-24 VITALS — BP 142/84 | HR 93 | Ht 65.0 in | Wt 155.4 lb

## 2018-10-24 DIAGNOSIS — F515 Nightmare disorder: Secondary | ICD-10-CM

## 2018-10-24 DIAGNOSIS — I10 Essential (primary) hypertension: Secondary | ICD-10-CM | POA: Diagnosis not present

## 2018-10-24 DIAGNOSIS — R000 Tachycardia, unspecified: Secondary | ICD-10-CM

## 2018-10-24 NOTE — Patient Instructions (Signed)
Medication Instructions:  Continue all current medications.  Labwork: none  Testing/Procedures: none  Follow-Up: As needed.    Any Other Special Instructions Will Be Listed Below (If Applicable).  If you need a refill on your cardiac medications before your next appointment, please call your pharmacy.  

## 2018-10-24 NOTE — Progress Notes (Signed)
CARDIOLOGY CONSULT NOTE  Patient ID: [name] MRN: [id number] DOB/AGE: 28-Mar-1956 62 y.o.  Admit date: (Not on file) Primary Physician: Asencion Noble, MD Referring Physician: Asencion Noble, MD  Reason for Consultation: Arrhythmia  HPI: [name] is a 62 y.o. female who is being seen today for the evaluation of arrhythmia at the request of Asencion Noble, MD.   She was apparently evaluated at [facility]).  Poor quality fax records were sent over which are unable to be read.  She tells me she underwent surgery in December 2018 and had an elevated heart rate.  She has an upcoming nasal surgery in early December 2019 and has been sent to me for a preoperative cardiac evaluation.  She used to weigh about 130 pounds but has put on about 25 pounds over the past 3 years.  She underwent left heel surgery and has some screws which are coming out and needs to have these removed.  She has not been exercising and has not been eating as healthy as she should be.  She denies exertional chest pain and exertional dyspnea.  She does do some walking with her dog and denies exertional symptoms.  She told me that she dreams a lot and has nightmares.  She typically takes Unisom to sleep.  She recently underwent a colonoscopy and they used propofol for sedation and she had no cardiac or hemodynamic issues.  ECG performed in the office today which I ordered and personally interpreted demonstrates normal sinus rhythm with no ischemic ST segment or T-wave abnormalities, nor any arrhythmias.  I reviewed labs from her PCP dated 11/22/2017: BUN 10, creatinine 0.62, sodium 139, potassium 4.7, hemoglobin 13.4, total cholesterol 204, triglycerides 72, HDL 77, LDL 113.   Family history: Her father had an MI at age 16 and died of an MI at age 8.  He was a smoker and had insulin-dependent diabetes mellitus.  Social history: Her husband, [name], is also my patient.  He underwent ICD placement  by Dr. Jamey Reas in [place].   Allergies  Allergen Reactions  . Prednisone Other (See Comments)    Mental status change and vaginal bleed  . Other Itching    Oxycodone/Hydrocodone causes itching    Current Outpatient Medications  Medication Sig Dispense Refill  . ALPRAZolam (XANAX) 0.25 MG tablet Take 1 tablet (0.25 mg total) by mouth as needed for anxiety or sleep. 30 tablet 1  . B Complex-C (B-COMPLEX WITH VITAMIN C) tablet Take 1 tablet by mouth daily.    . Calcium Carbonate Antacid (TUMS PO) Take by mouth as needed.    . Calcium Citrate-Vitamin D (EQ CALCIUM CITRATE+D3 PO) Take 1 tablet by mouth daily.    Marland Kitchen denosumab (PROLIA) 60 MG/ML SOSY injection Inject 60 mg into the skin every 6 (six) months.    . diphenhydrAMINE (BENADRYL) 25 MG tablet Take 25 mg by mouth every 6 (six) hours as needed for itching or sleep (Takes half a tablet when needed for itching or sleep).    . diphenhydramine-acetaminophen (TYLENOL PM) 25-500 MG TABS tablet Take 1 tablet by mouth at bedtime as needed.    . doxylamine, Sleep, (UNISOM) 25 MG tablet Take 12.5-25 mg by mouth at bedtime as needed for sleep.    Marland Kitchen lisinopril (PRINIVIL,ZESTRIL) 10 MG tablet Take 10 mg by mouth every morning.     . Multiple Vitamins-Minerals (CENTRUM SILVER 50+WOMEN) TABS Take 1 tablet by mouth daily.     Marland Kitchen  Omega-3 Fatty Acids (FISH OIL) 1000 MG CAPS Take 1 capsule by mouth daily.    Marland Kitchen omeprazole (PRILOSEC) 20 MG capsule Take 20 mg by mouth as needed.     No current facility-administered medications for this visit.     Past Medical History:  Diagnosis Date  . Arthritis    hands  . Borderline glaucoma   . Borderline glaucoma of both eyes   . Breast fibrocystic disorder   . Deviated septum    residual post septorhinoplasty yrs ago  . Endometrial polyp   . GERD (gastroesophageal reflux disease)   . History of adenomatous polyp of colon    2004;  2014 tubular adenoma  . History of hiatal hernia   . Hypertension   .  Osteoporosis   . Vocal nodules in adults    per pt hx nodules due to severe acid reflux  s/p  nissen fundoplication 9563  and in 2008 seen by dr Sherre Lain and recieved speech therapy    Past Surgical History:  Procedure Laterality Date  . [place]  . COLONOSCOPY  last one 05-07-2013  . D & C HYSTEROSCOPY POLYPECTOMY  04-15-2004   dr Ashley Murrain  . DILATATION & CURETTAGE/HYSTEROSCOPY WITH MYOSURE N/A 01/20/2017   Procedure: DILATATION & CURETTAGE/HYSTEROSCOPY WITH MYOSURE;  Surgeon: Terrance Mass, MD;  Location[facility];  Service: Gynecology;  Laterality: N/A;  . ESOPHAGOGASTRODUODENOSCOPY  last one 07-29-2008  . NISSEN FUNDOPLICATION  8756  approx.  . ORIF CALCANEOUS FRACTURE Left 01/15/2016   Procedure: OPEN REDUCTION INTERNAL FIXATION (ORIF) LEFT CALCANEOUS FRACTURE;  Surgeon: Marybelle Killings, MD;  Location: [facility];  Service: Orthopedics;  Laterality: Left;  . SEPTORHINOPLASTY  1980 approx  . TONSILLECTOMY  age 62    Social History   Socioeconomic History  . Marital status: Married    Spouse name: Not on file  . Number of children: Not on file  . Years of education: Not on file  . Highest education level: Not on file  Occupational History  . Not on file  Social Needs  . Financial resource strain: Not on file  . Food insecurity:    Worry: Not on file    Inability: Not on file  . Transportation needs:    Medical: Not on file    Non-medical: Not on file  Tobacco Use  . Smoking status: Never Smoker  . Smokeless tobacco: Never Used  Substance and Sexual Activity  . Alcohol use: Yes    Alcohol/week: 10.0 standard drinks    Types: 4 Glasses of wine, 6 Standard drinks or equivalent per week  . Drug use: No  . Sexual activity: Yes    Comment: VASECTOMY  Lifestyle  . Physical activity:    Days per week: Not on file    Minutes per session: Not on file  . Stress: Not on file  Relationships  . Social connections:    Talks on phone: Not  on file    Gets together: Not on file    Attends religious service: Not on file    Active member of club or organization: Not on file    Attends meetings of clubs or organizations: Not on file    Relationship status: Not on file  . Intimate partner violence:    Fear of current or ex partner: Not on file    Emotionally abused: Not on file    Physically abused: Not on file    Forced sexual  activity: Not on file  Other Topics Concern  . Not on file  Social History Narrative  . Not on file      Current Meds  Medication Sig  . ALPRAZolam (XANAX) 0.25 MG tablet Take 1 tablet (0.25 mg total) by mouth as needed for anxiety or sleep.  . B Complex-C (B-COMPLEX WITH VITAMIN C) tablet Take 1 tablet by mouth daily.  . Calcium Carbonate Antacid (TUMS PO) Take by mouth as needed.  . Calcium Citrate-Vitamin D (EQ CALCIUM CITRATE+D3 PO) Take 1 tablet by mouth daily.  Marland Kitchen denosumab (PROLIA) 60 MG/ML SOSY injection Inject 60 mg into the skin every 6 (six) months.  . diphenhydrAMINE (BENADRYL) 25 MG tablet Take 25 mg by mouth every 6 (six) hours as needed for itching or sleep (Takes half a tablet when needed for itching or sleep).  . diphenhydramine-acetaminophen (TYLENOL PM) 25-500 MG TABS tablet Take 1 tablet by mouth at bedtime as needed.  . doxylamine, Sleep, (UNISOM) 25 MG tablet Take 12.5-25 mg by mouth at bedtime as needed for sleep.  Marland Kitchen lisinopril (PRINIVIL,ZESTRIL) 10 MG tablet Take 10 mg by mouth every morning.   . Multiple Vitamins-Minerals (CENTRUM SILVER 50+WOMEN) TABS Take 1 tablet by mouth daily.   . Omega-3 Fatty Acids (FISH OIL) 1000 MG CAPS Take 1 capsule by mouth daily.  Marland Kitchen omeprazole (PRILOSEC) 20 MG capsule Take 20 mg by mouth as needed.      Review of systems complete and found to be negative unless listed above in HPI    Physical exam Blood pressure (!) 142/84, pulse 93, height 5\' 5"  (1.651 m), weight 155 lb 6.4 oz (70.5 kg), SpO2 99 %. General: NAD Neck: No JVD, no  thyromegaly or thyroid nodule.  Lungs: Clear to auscultation bilaterally with normal respiratory effort. CV: Nondisplaced PMI. Regular rate and rhythm, normal S1/S2, no S3/S4, no murmur.  No peripheral edema.  No carotid bruit.    Abdomen: Soft, nontender, no distention.  Skin: Intact without lesions or rashes.  Neurologic: Alert and oriented x 3.  Psych: Normal and slightly anxious affect. Extremities: No clubbing or cyanosis.  HEENT: Normal.   ECG: Most recent ECG reviewed.   Labs: Lab Results  Component Value Date/Time   K 4.0 01/19/2017 12:06 PM   BUN 11 01/19/2017 12:06 PM   CREATININE 0.76 01/19/2017 12:06 PM   CREATININE 0.65 12/12/2013 12:18 PM   ALT 22 01/15/2016 09:11 AM   TSH 1.324 12/12/2013 12:18 PM   HGB 14.2 01/19/2017 12:06 PM     Lipids: No results found for: LDLCALC, LDLDIRECT, CHOL, TRIG, HDL      ASSESSMENT AND PLAN:   1.  Elevated heart rate: I will try to obtain records from [facility].  She is currently symptomatically stable.  ECG is unremarkable.  At this time I do not recommend any noninvasive cardiac studies.  She recently underwent a colonoscopy using propofol without hemodynamic consequence.  2.  Hypertension: Blood pressure is mildly elevated but she is quite anxious.  No changes to therapy.  3.  Sleep disturbance/nightmares: We talked about sleep hygiene and limiting brain stimulation prior to sleep such as limiting use of smart phones and TV watching.   Disposition: Follow up as needed   Signed: Kate Sable, M.D., F.A.C.C.  10/24/2018, 10:29 AM

## 2018-10-31 ENCOUNTER — Telehealth: Payer: Self-pay | Admitting: Cardiovascular Disease

## 2018-11-01 ENCOUNTER — Telehealth: Payer: Self-pay | Admitting: Cardiovascular Disease

## 2018-11-01 NOTE — Telephone Encounter (Signed)
I received a call from Dr. Renea Ee, the anesthesiologist who supervised her surgery at [facility]. She told me that the patient was initially given propofol and was then given phenylephrine which decreased her heart rate.  She was then given Robinul and glycopyrrolate.  She developed a mild sinus tachycardia, with the highest heart rate being 123 bpm.  It then gradually came down to 110 bpm.  She was given 3 separate doses of IV metoprolol 2 mg for a total of 6 mg.  She then maintained normal sinus rhythm.  Dr. Mickel Duhamel told me that disinhibition with propofol is quite common and a mild sinus tachycardia is commonly seen, similar to what the patient had.  She did not observe any significant arrhythmias.  The patient did very well with the surgery.  The patient was also noted to be quite anxious both in the preoperative and postoperative period.

## 2018-11-06 NOTE — Telephone Encounter (Signed)
Error

## 2018-11-07 ENCOUNTER — Telehealth: Payer: Self-pay | Admitting: Cardiovascular Disease

## 2018-11-07 NOTE — Telephone Encounter (Signed)
Patient informed and verbalized understanding.   Patient also wanted to know what she can do about her fast heart rate. Reported feeling her heart beating fast hours after exercise and at rest. Patient has not checked her heart rate during these episodes. Patient advised to contact our office on Friday or Monday with her HR results after exercise and at rest. Verbalized understanding.

## 2018-11-07 NOTE — Telephone Encounter (Signed)
Patient called wanting to know if Dr. Bronson Ing had reviewed records. She is scheduled for surgery December 2019

## 2018-11-07 NOTE — Telephone Encounter (Signed)
Please see my phone note from 11/01/18.  I reviewed all notes and spoke with her anesthesiologist.  She had a mild sinus tachycardia.  She is safe to undergo surgery in December from my perspective.

## 2019-10-01 ENCOUNTER — Other Ambulatory Visit: Payer: Self-pay

## 2019-10-01 DIAGNOSIS — Z20822 Contact with and (suspected) exposure to covid-19: Secondary | ICD-10-CM

## 2019-10-03 LAB — NOVEL CORONAVIRUS, NAA: SARS-CoV-2, NAA: NOT DETECTED

## 2019-10-21 ENCOUNTER — Ambulatory Visit (HOSPITAL_COMMUNITY): Payer: BC Managed Care – PPO | Attending: Neurosurgery | Admitting: Physical Therapy

## 2019-10-21 ENCOUNTER — Encounter (HOSPITAL_COMMUNITY): Payer: Self-pay | Admitting: Physical Therapy

## 2019-10-21 ENCOUNTER — Other Ambulatory Visit: Payer: Self-pay

## 2019-10-21 DIAGNOSIS — M5412 Radiculopathy, cervical region: Secondary | ICD-10-CM | POA: Diagnosis not present

## 2019-10-21 NOTE — Therapy (Signed)
[place], [place], [address] Phone: [phone]   Fax:  [phone]  Physical Therapy Evaluation  Patient Details  Name: [name] MRN: [id number] Date of Birth: 1956-12-21 Referring Provider (PT): Earnie Larsson    Encounter Date: 10/21/2019  PT End of Session - 10/21/19 1409    Visit Number  1    Number of Visits  8    Date for PT Re-Evaluation  11/18/19    Authorization Type  [employer]    PT Start Time  1315    PT Stop Time  1400    PT Time Calculation (min)  45 min    Activity Tolerance  Patient tolerated treatment well    Behavior During Therapy  [facility] for tasks assessed/performed       Past Medical History:  Diagnosis Date  . Arthritis    hands  . Borderline glaucoma   . Borderline glaucoma of both eyes   . Breast fibrocystic disorder   . Deviated septum    residual post septorhinoplasty yrs ago  . Endometrial polyp   . GERD (gastroesophageal reflux disease)   . History of adenomatous polyp of colon    2004;  2014 tubular adenoma  . History of hiatal hernia   . Hypertension   . Osteoporosis   . Vocal nodules in adults    per pt hx nodules due to severe acid reflux  s/p  nissen fundoplication 0000000  and in 2008 seen by dr Sherre Lain and recieved speech therapy    Past Surgical History:  Procedure Laterality Date  . [place]  . COLONOSCOPY  last one 05-07-2013  . D & C HYSTEROSCOPY POLYPECTOMY  04-15-2004   dr Ashley Murrain  . DILATATION & CURETTAGE/HYSTEROSCOPY WITH MYOSURE N/A 01/20/2017   Procedure: DILATATION & CURETTAGE/HYSTEROSCOPY WITH MYOSURE;  Surgeon: Terrance Mass, MD;  Location: [facility];  Service: Gynecology;  Laterality: N/A;  . ESOPHAGOGASTRODUODENOSCOPY  last one 07-29-2008  . NISSEN FUNDOPLICATION  0000000  approx.  . ORIF CALCANEOUS FRACTURE Left 01/15/2016   Procedure: OPEN REDUCTION INTERNAL FIXATION (ORIF) LEFT CALCANEOUS FRACTURE;  Surgeon:  Marybelle Killings, MD;  Location: [facility];  Service: Orthopedics;  Laterality: Left;  . SEPTORHINOPLASTY  1980 approx  . TONSILLECTOMY  age 62    There were no vitals filed for this visit.   Subjective Assessment - 10/21/19 1311    Subjective  [name] has had cervical pain for years.  Recently the pain has progressed after working out with  A 3# wt.  Her pain  Is mainly  going down into her shoulder but occasionally goes  into her lt arm along with some numbness, therefore, she sought medical advice who has referred her to therapy.  She was given a steroid whch has helped her pain quite a bit.   For the past six weeks it felt as if a bee was stinging her then it progressed to a stabbing pain in the LT cervical area.    Pertinent History  osteoporosis, OA    Limitations  Reading;Lifting;House hold activities    How long can you sit comfortably?  no more than 5 minutes prior to the steroid.    How long can you stand comfortably?  less pain with standing    How long can you walk comfortably?  prior to steroid her neck would hurt right away and progress after about a mile.  Patient Stated Goals  less pain, less headaches, to be able to move her head more easily.    Currently in Pain?  Yes    Pain Score  4    was a 10/10 prior to the steroid   Pain Location  Neck    Pain Orientation  Left    Pain Descriptors / Indicators  Aching    Pain Type  Chronic pain    Pain Radiating Towards  Lt shoulder level occasionally into arm    Pain Onset  More than a month ago    Pain Frequency  Constant    Aggravating Factors   driving    Pain Relieving Factors  steroids,    Effect of Pain on Daily Activities  limits    Multiple Pain Sites  No         [facility] PT Assessment - 10/21/19 0001      Assessment   Medical Diagnosis  cervial radiculopathy    Referring Provider (PT)  Earnie Larsson     Onset Date/Surgical Date  09/16/19    Hand Dominance  Right    Next MD Visit  11/06/2019    Prior Therapy  yes in  the past       Precautions   Precautions  None      Restrictions   Weight Bearing Restrictions  No      Balance Screen   Has the patient fallen in the past 6 months  No    Has the patient had a decrease in activity level because of a fear of falling?   No    Is the patient reluctant to leave their home because of a fear of falling?   No      Home Film/video editor residence      Prior Function   Level of Independence  Independent    Vocation  Retired    Leisure  exercise and walking       Observation/Other Assessments   Focus on Therapeutic Outcomes (FOTO)   49      Posture/Postural Control   Posture/Postural Control  No significant limitations    Postural Limitations  --      ROM / Strength   AROM / PROM / Strength  Strength;AROM      AROM   AROM Assessment Site  Cervical    Cervical Flexion  50   reps improve pain    Cervical Extension  30   reps increases pain    Cervical - Right Side Bend  25   reps improve    Cervical - Left Side Bend  32   reps increase pain .   Cervical - Right Rotation  80    Cervical - Left Rotation  75      Strength   Strength Assessment Site  Cervical    Cervical Extension  4/5    Cervical - Right Side Bend  4/5    Cervical - Left Side Bend  4/5      Flexibility   Soft Tissue Assessment /Muscle Length  --      Palpation   Palpation comment  mm spasm bilateral upper trap  as well as thoracic paraspinal mm                 Objective measurements completed on examination: See above findings.      [facility] Adult PT Treatment/Exercise - 10/21/19 0001      Exercises  Exercises  Neck      Neck Exercises: Seated   Neck Retraction  5 reps    Other Seated Exercise  cervical excursions x 3; scapular retraction x 10       Manual Therapy   Manual Therapy  Soft tissue mobilization    Manual therapy comments  completed seperate from all other aspects of treatment    Soft tissue mobilization   efflurage/pettrisage and pressure to decrase mm spasm              PT Education - 10/21/19 1407    Education Details  HEP, increased pressure in cervical area the more you flex so be careful on laptop, Ipads    Person(s) Educated  Patient    Methods  Explanation    Comprehension  Verbalized understanding       PT Short Term Goals - 10/21/19 1422      PT SHORT TERM GOAL #1   Title  PT to no longer have any sx of numbing going down into her LT arm to demonstrate decrease nerve root irritation.    Time  2    Period  Weeks    Status  New    Target Date  11/04/19      PT SHORT TERM GOAL #2   Title  Pt to be I in HEP to decrease nerve irritation to allow cervical pain to be no greater than a 5/10 to allow pt to complete ADL's with decreased discomfort.    Time  2    Period  Weeks    Status  New        PT Long Term Goals - 10/21/19 1425      PT LONG TERM GOAL #1   Title  PT cervical strength to be 5/5 to be able to demonstrate the ability to stabilize her cervical area while lifting ten pounds,(ie stack of dinner plates), into a cabinet at shoulder height without increased pain.    Time  4    Period  Weeks    Status  New    Target Date  11/18/19      PT LONG TERM GOAL #2   Title  PT cervical pain to be no greater than a 2/10 while driving for a 30 minutes period.    Time  4    Period  Weeks    Status  New      PT LONG TERM GOAL #3   Title  PT to be abel to rotate her neck 80 degrees to the left in order to see in her blind spot while driving.    Time  4    Period  Weeks    Status  New             Plan - 10/21/19 1414    Clinical Impression Statement  [name] is a 63 yo female who has had a history of chronic cervical pain.  He pain has been exacerbated for the past five weeks due to exercising with 3#wts.  She went to her MD who gave her an order of prednisone, (which has significantly decreased her pain), as well as referred her to skilled PT.   Evaluation demonstrates decreased ROM, decreased strength, increased mm spasm and incresed pain.  [name] will benefit from skilled PT to address these issues and instruct pt in cervical stabilization exercises to decrease her pain and keep her pain from exacerbating in the future.    Examination-Activity Limitations  Carry;Lift;Sit;Sleep;Locomotion Level    Examination-Participation Restrictions  --   does everything just deals with the pain   Stability/Clinical Decision Making  Stable/Uncomplicated    Clinical Decision Making  Low    Rehab Potential  Good    PT Frequency  2x / week    PT Duration  4 weeks    PT Treatment/Interventions  ADLs/Self Care Home Management;Patient/family education;Manual techniques;Therapeutic activities;Therapeutic exercise    PT Next Visit Plan  Scapular t-band exercises while keeping cervical area stable, x to V and W back; continue with manual with trial of manual traction.    PT Home Exercise Plan  cervical excursion, retraction, scapular retraction.       Patient will benefit from skilled therapeutic intervention in order to improve the following deficits and impairments:  Decreased range of motion, Decreased strength, Increased muscle spasms, Pain, Improper body mechanics  Visit Diagnosis: Radiculopathy, cervical region - Plan: PT plan of care cert/re-cert     Problem List Patient Active Problem List   Diagnosis Date Noted  . Post-menopause 12/30/2016  . Calcaneal fracture 01/15/2016  . History of ovarian cyst 01/21/2013  . Cervicalgia 04/12/2012  . Breast fibrocystic disorder   . Endometrial polyp   . Osteoporosis   . Colon polyps   . Glaucoma   . ANXIETY STATE, UNSPECIFIED 07/03/2008  . INTERNAL HEMORRHOIDS 07/03/2008  . ESOPHAGEAL REFLUX 07/03/2008  . DYSPEPSIA 07/03/2008  . COLONIC POLYPS, ADENOMATOUS, HX OF 07/03/2008    Rayetta Humphrey, PT CLT [phone] 10/21/2019, 2:31 PM  [facility] [address] [place], [place], [address] Phone: [phone]   Fax:  [phone]  Name: [name] MRN: [id number] Date of Birth: 10/11/1956

## 2019-10-21 NOTE — Patient Instructions (Addendum)
Flexibility: Neck Retraction    Pull head straight back, keeping eyes and jaw level. Repeat __5__ times per set. Do __1__ sets per session. Do _2___ sessions per day.  http://orth.exer.us/344   Copyright  VHI. All rights reserved.  Scapular Retraction (Standing)    With arms at sides, pinch shoulder blades together. Repeat _10___ times per set. Do _1___ sets per session. Do __2__ sessions per day.  http://orth.exer.us/944   Copyright  VHI. All rights reserved.

## 2019-10-23 ENCOUNTER — Ambulatory Visit (HOSPITAL_COMMUNITY): Payer: BC Managed Care – PPO

## 2019-10-23 ENCOUNTER — Other Ambulatory Visit: Payer: Self-pay

## 2019-10-23 DIAGNOSIS — M5412 Radiculopathy, cervical region: Secondary | ICD-10-CM

## 2019-10-23 NOTE — Therapy (Signed)
[place], [place], [address] Phone: [phone]   Fax:  [phone]  Physical Therapy Treatment  Patient Details  Name: [name] MRN: [id number] Date of Birth: May 13, 1956 Referring Provider (PT): Earnie Larsson    Encounter Date: 10/23/2019  PT End of Session - 10/23/19 1404    Visit Number  2    Number of Visits  8    Date for PT Re-Evaluation  11/18/19    Authorization Type  BC[employer]    PT Start Time  1350    PT Stop Time  1432    PT Time Calculation (min)  42 min    Activity Tolerance  Patient tolerated treatment well    Behavior During Therapy  [facility] for tasks assessed/performed       Past Medical History:  Diagnosis Date  . Arthritis    hands  . Borderline glaucoma   . Borderline glaucoma of both eyes   . Breast fibrocystic disorder   . Deviated septum    residual post septorhinoplasty yrs ago  . Endometrial polyp   . GERD (gastroesophageal reflux disease)   . History of adenomatous polyp of colon    2004;  2014 tubular adenoma  . History of hiatal hernia   . Hypertension   . Osteoporosis   . Vocal nodules in adults    per pt hx nodules due to severe acid reflux  s/p  nissen fundoplication 0000000  and in 2008 seen by dr Sherre Lain and recieved speech therapy    Past Surgical History:  Procedure Laterality Date  . [place]  . COLONOSCOPY  last one 05-07-2013  . D & C HYSTEROSCOPY POLYPECTOMY  04-15-2004   dr Ashley Murrain  . DILATATION & CURETTAGE/HYSTEROSCOPY WITH MYOSURE N/A 01/20/2017   Procedure: DILATATION & CURETTAGE/HYSTEROSCOPY WITH MYOSURE;  Surgeon: Terrance Mass, MD;  Location: [facility];  Service: Gynecology;  Laterality: N/A;  . ESOPHAGOGASTRODUODENOSCOPY  last one 07-29-2008  . NISSEN FUNDOPLICATION  0000000  approx.  . ORIF CALCANEOUS FRACTURE Left 01/15/2016   Procedure: OPEN REDUCTION INTERNAL FIXATION (ORIF) LEFT CALCANEOUS FRACTURE;  Surgeon:  Marybelle Killings, MD;  Location: [place];  Service: Orthopedics;  Laterality: Left;  . SEPTORHINOPLASTY  1980 approx  . TONSILLECTOMY  age 23    There were no vitals filed for this visit.  Subjective Assessment - 10/23/19 1355    Subjective  Pt reports just finished methylprednisolone 4MG  tablets, no issues and wants to make [place] aware of medication change. 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    Pertinent History  osteoporosis, OA    Limitations  Reading;Lifting;House hold activities    How long can you sit comfortably?  no more than 5 minutes prior to the steroid.    How long can you stand comfortably?  less pain with standing    How long can you walk comfortably?  prior to steroid her neck would hurt right away and progress after about a mile.    Patient Stated Goals  less pain, less headaches, to be able to move her head more easily.    Currently in Pain?  Yes    Pain Score  2     Pain Location  Neck    Pain Orientation  Left    Pain Descriptors / Indicators  Tingling   "  between an ache and a stab"   Pain Onset  More than a month ago    Pain Frequency  Constant    Aggravating Factors   driving    Pain Relieving Factors  steroids    Effect of Pain on Daily Activities  limits                [facility] Adult PT Treatment/Exercise - 10/23/19 0001      Neck Exercises: Seated   X to V  10 reps    X to V Limitations  verbal cues to relax shoulders    Other Seated Exercise  cervical flexion/extension, x10 reps; cervical rotation R/L, x10 reps; cervical sidebending R/L, x10 reps    Other Seated Exercise  scapular retraction, 15 reps; w-back, 15 reps; rows, RTB, 15 reps; shoulder extension, RTB, 15 reps      Manual Therapy   Manual Therapy  Soft tissue mobilization;Manual Traction    Manual therapy comments  completed seperate from all other aspects of treatment    Soft tissue mobilization   STM to suboccipitals to reduce tension and pain    Manual Traction  Manual traction with 10x10 sec hold and 4x 30 sec hold      Neck Exercises: Stretches   Upper Trapezius Stretch  Left;2 reps;30 seconds    Levator Stretch  Left;2 reps;30 seconds             PT Education - 10/23/19 1401    Education Details  Reviewed HEP and answered questions, goals, exercise technique    Person(s) Educated  Patient    Methods  Explanation    Comprehension  Verbalized understanding       PT Short Term Goals - 10/23/19 1402      PT SHORT TERM GOAL #1   Title  PT to no longer have any sx of numbing going down into her LT arm to demonstrate decrease nerve root irritation.    Time  2    Period  Weeks    Status  On-going    Target Date  11/04/19      PT SHORT TERM GOAL #2   Title  Pt to be I in HEP to decrease nerve irritation to allow cervical pain to be no greater than a 5/10 to allow pt to complete ADL's with decreased discomfort.    Time  2    Period  Weeks    Status  On-going        PT Long Term Goals - 10/23/19 1402      PT LONG TERM GOAL #1   Title  PT cervical strength to be 5/5 to be able to demonstrate the ability to stabilize her cervical area while lifting ten pounds,(ie stack of dinner plates), into a cabinet at shoulder height without increased pain.    Time  4    Period  Weeks    Status  On-going      PT LONG TERM GOAL #2   Title  PT cervical pain to be no greater than a 2/10 while driving for a 30 minutes period.    Time  4    Period  Weeks    Status  On-going      PT LONG TERM GOAL #3   Title  PT to be abel to rotate her neck 80 degrees to the left in order to see in her blind spot while driving.    Time  4    Period  Weeks  Status  On-going            Plan - 10/23/19 1436    Clinical Impression Statement  Began treatment with HEP review. Pt requires verbal cues throughout exercise perform for form and able to maintain good form after cued. Added  rows, x to V, w-back and shoulder extension in standing without increasing pain. Ended with manual traction and suboccipital release to reduce pain, initially with too much manual pull and improved pain relief with less traction. Pt with increased symptoms with cervical L sidebending and cued to reduce AROM to reduce symptoms. Pt with soreness at EOS and educated to stretch and drink water to limit muscle soreness after exercise. Continue to progress as able.    Examination-Activity Limitations  Carry;Lift;Sit;Sleep;Locomotion Level    Examination-Participation Restrictions  --   does everything just deals with the pain   Stability/Clinical Decision Making  Stable/Uncomplicated    Rehab Potential  Good    PT Frequency  2x / week    PT Duration  4 weeks    PT Treatment/Interventions  ADLs/Self Care Home Management;Patient/family education;Manual techniques;Therapeutic activities;Therapeutic exercise    PT Next Visit Plan  Continue scapular t-band exercises while keeping cervical area stable, x to V and W back and other postural strengthening exercises. Continue with manual STM, retry manual traction if indications arise.    PT Home Exercise Plan  cervical excursion, retraction, scapular retraction.    Consulted and Agree with Plan of Care  Patient       Patient will benefit from skilled therapeutic intervention in order to improve the following deficits and impairments:  Decreased range of motion, Decreased strength, Increased muscle spasms, Pain, Improper body mechanics  Visit Diagnosis: Radiculopathy, cervical region     Problem List Patient Active Problem List   Diagnosis Date Noted  . Post-menopause 12/30/2016  . Calcaneal fracture 01/15/2016  . History of ovarian cyst 01/21/2013  . Cervicalgia 04/12/2012  . Breast fibrocystic disorder   . Endometrial polyp   . Osteoporosis   . Colon polyps   . Glaucoma   . ANXIETY STATE, UNSPECIFIED 07/03/2008  . INTERNAL HEMORRHOIDS  07/03/2008  . ESOPHAGEAL REFLUX 07/03/2008  . DYSPEPSIA 07/03/2008  . COLONIC POLYPS, ADENOMATOUS, HX OF 07/03/2008     Talbot Grumbling PT, DPT 10/23/19, 2:44 PM [place], [place], [address] Phone: [phone]   Fax:  [phone]  Name: [name] MRN: [id number] Date of Birth: May 27, 1956

## 2019-10-28 ENCOUNTER — Other Ambulatory Visit: Payer: Self-pay

## 2019-10-28 ENCOUNTER — Encounter (HOSPITAL_COMMUNITY): Payer: Self-pay

## 2019-10-28 ENCOUNTER — Ambulatory Visit (HOSPITAL_COMMUNITY): Payer: BC Managed Care – PPO | Attending: Physician Assistant

## 2019-10-28 DIAGNOSIS — M5412 Radiculopathy, cervical region: Secondary | ICD-10-CM | POA: Diagnosis present

## 2019-10-28 NOTE — Therapy (Signed)
[place], [place], [address] Phone: [phone]   Fax:  [phone]  Physical Therapy Treatment  Patient Details  Name: [name] MRN: [id number] Date of Birth: August 14, 1956 Referring Provider (PT): Earnie Larsson    Encounter Date: 10/28/2019  PT End of Session - 10/28/19 1434    Visit Number  3    Number of Visits  8    Date for PT Re-Evaluation  11/18/19    Authorization Type  BCBS    PT Start Time  1435    PT Stop Time  1520    PT Time Calculation (min)  45 min    Activity Tolerance  Patient tolerated treatment well    Behavior During Therapy  [facility] for tasks assessed/performed       Past Medical History:  Diagnosis Date  . Arthritis    hands  . Borderline glaucoma   . Borderline glaucoma of both eyes   . Breast fibrocystic disorder   . Deviated septum    residual post septorhinoplasty yrs ago  . Endometrial polyp   . GERD (gastroesophageal reflux disease)   . History of adenomatous polyp of colon    2004;  2014 tubular adenoma  . History of hiatal hernia   . Hypertension   . Osteoporosis   . Vocal nodules in adults    per pt hx nodules due to severe acid reflux  s/p  nissen fundoplication 0000000  and in 2008 seen by dr Sherre Lain and recieved speech therapy    Past Surgical History:  Procedure Laterality Date  . [place]  . COLONOSCOPY  last one 05-07-2013  . D & C HYSTEROSCOPY POLYPECTOMY  04-15-2004   dr Ashley Murrain  . DILATATION & CURETTAGE/HYSTEROSCOPY WITH MYOSURE N/A 01/20/2017   Procedure: DILATATION & CURETTAGE/HYSTEROSCOPY WITH MYOSURE;  Surgeon: Terrance Mass, MD;  Location: [facility];  Service: Gynecology;  Laterality: N/A;  . ESOPHAGOGASTRODUODENOSCOPY  last one 07-29-2008  . NISSEN FUNDOPLICATION  0000000  approx.  . ORIF CALCANEOUS FRACTURE Left 01/15/2016   Procedure: OPEN REDUCTION INTERNAL FIXATION (ORIF) LEFT CALCANEOUS FRACTURE;  Surgeon:  Marybelle Killings, MD;  Location: [facility];  Service: Orthopedics;  Laterality: Left;  . SEPTORHINOPLASTY  1980 approx  . TONSILLECTOMY  age 77    There were no vitals filed for this visit.  Subjective Assessment - 10/28/19 1437    Subjective  Pt reports performing her exercises at home, no terrible pain but does have some numbness still but improvements in sharp pain. Pt reports walking 2 miles yesterday and it was too much. Pt reports sitting in car is still painful and difficult.    Pertinent History  osteoporosis, OA    Limitations  Reading;Lifting;House hold activities    How long can you sit comfortably?  no more than 5 minutes prior to the steroid.    How long can you stand comfortably?  less pain with standing    How long can you walk comfortably?  prior to steroid her neck would hurt right away and progress after about a mile.    Patient Stated Goals  less pain, less headaches, to be able to move her head more easily.    Currently in Pain?  Yes    Pain Score  2     Pain Location  Neck    Pain Orientation  Left    Pain Descriptors / Indicators  Tingling  Pain Type  Chronic pain    Pain Radiating Towards  L shoulder level occasionally into arm    Pain Onset  More than a month ago    Pain Frequency  Constant    Aggravating Factors   driving    Pain Relieving Factors  steroids    Effect of Pain on Daily Activities  limits                [facility] Adult PT Treatment/Exercise - 10/28/19 0001      Neck Exercises: Seated   X to V  15 reps    X to V Limitations  verbal cues to relax shoulders    Other Seated Exercise  cervical flexion/extension, x10 reps; cervical rotation R/L, x10 reps; cervical sidebending R/L, x10 reps    Other Seated Exercise  scapular retraction, 15 reps; rows, RTB, 15 reps; shoulder extension, RTB, 15 reps      Neck Exercises: Supine   Neck Retraction  15 reps    Neck Retraction Limitations  3 sets with manual traction between to improve tingling sensation       Neck Exercises: Prone   Other Prone Exercise  quadruped scapular push ups, x15 reps; quadruped opposite UE raise, x10 reps each arm      Manual Therapy   Manual Therapy  Manual Traction;Soft tissue mobilization    Manual therapy comments  completed seperate from all other aspects of treatment    Soft tissue mobilization  pt seated, manual STM to L upper trap and cervical paraspinals to decrease spasm and pain    Manual Traction  Manual traction with 3x1 min hold             PT Education - 10/28/19 1439    Education Details  Exercise technique, updated HEP    Person(s) Educated  Patient    Methods  Explanation;Demonstration;Handout    Comprehension  Verbalized understanding;Returned demonstration       PT Short Term Goals - 10/23/19 1402      PT SHORT TERM GOAL #1   Title  PT to no longer have any sx of numbing going down into her LT arm to demonstrate decrease nerve root irritation.    Time  2    Period  Weeks    Status  On-going    Target Date  11/04/19      PT SHORT TERM GOAL #2   Title  Pt to be I in HEP to decrease nerve irritation to allow cervical pain to be no greater than a 5/10 to allow pt to complete ADL's with decreased discomfort.    Time  2    Period  Weeks    Status  On-going        PT Long Term Goals - 10/23/19 1402      PT LONG TERM GOAL #1   Title  PT cervical strength to be 5/5 to be able to demonstrate the ability to stabilize her cervical area while lifting ten pounds,(ie stack of dinner plates), into a cabinet at shoulder height without increased pain.    Time  4    Period  Weeks    Status  On-going      PT LONG TERM GOAL #2   Title  PT cervical pain to be no greater than a 2/10 while driving for a 30 minutes period.    Time  4    Period  Weeks    Status  On-going  PT LONG TERM GOAL #3   Title  PT to be abel to rotate her neck 80 degrees to the left in order to see in her blind spot while driving.    Time  4    Period  Weeks     Status  On-going            Plan - 10/28/19 1434    Clinical Impression Statement  Pt with increased tingling sensation with chin tucks in sitting, less intense in supine. Added manual cervical traction with static 1 minute hold improving, but not completely resolving numb/tingling sensation. Continued with postural strengthening with and without resistance bands pending tingling/numbness response with exercises. Added quadruped scap protraction/retraction and quadruped single UE raise exercises without increase in pain and intermittent tactile cues to improve performance. Ended with manual STM to L upper trap and cervical paraspinals with palpable spasm, pt reports slight increase in symptoms but not bad. 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 Continue to progress as able.    Examination-Activity Limitations  Carry;Lift;Sit;Sleep;Locomotion Level    Examination-Participation Restrictions  --   does everything just deals with the pain   Stability/Clinical Decision Making  Stable/Uncomplicated    Rehab Potential  Good    PT Frequency  2x / week    PT Duration  4 weeks    PT Treatment/Interventions  ADLs/Self Care Home Management;Patient/family education;Manual techniques;Therapeutic activities;Therapeutic exercise    PT Next Visit Plan  Progress resistance and reps as able with postural strengthening exercises. Continue manual STM and manual cervical traction for symptoms relief.    PT Home Exercise Plan  cervical excursion, retraction, scapular retraction; 11/2: rows, shoulder extension, and quadruped scap protraction/retraction    Consulted and Agree with Plan of Care  Patient       Patient will benefit from skilled therapeutic intervention in order to improve the following deficits and impairments:  Decreased range of motion, Decreased strength, Increased muscle spasms, Pain, Improper body mechanics  Visit  Diagnosis: Radiculopathy, cervical region     Problem List Patient Active Problem List   Diagnosis Date Noted  . Post-menopause 12/30/2016  . Calcaneal fracture 01/15/2016  . History of ovarian cyst 01/21/2013  . Cervicalgia 04/12/2012  . Breast fibrocystic disorder   . Endometrial polyp   . Osteoporosis   . Colon polyps   . Glaucoma   . ANXIETY STATE, UNSPECIFIED 07/03/2008  . INTERNAL HEMORRHOIDS 07/03/2008  . ESOPHAGEAL REFLUX 07/03/2008  . DYSPEPSIA 07/03/2008  . COLONIC POLYPS, ADENOMATOUS, HX OF 07/03/2008    Talbot Grumbling PT, DPT 10/28/19, 3:29 PM [place], [place], [address] Phone: [phone]   Fax:  [phone]  Name: [name] MRN: [id number] Date of Birth: [birth year]

## 2019-10-30 ENCOUNTER — Ambulatory Visit (HOSPITAL_COMMUNITY): Payer: BC Managed Care – PPO

## 2019-10-30 ENCOUNTER — Encounter (HOSPITAL_COMMUNITY): Payer: Self-pay

## 2019-10-30 ENCOUNTER — Other Ambulatory Visit: Payer: Self-pay

## 2019-10-30 DIAGNOSIS — M5412 Radiculopathy, cervical region: Secondary | ICD-10-CM | POA: Diagnosis not present

## 2019-10-30 NOTE — Therapy (Signed)
[place], [place], [address] Phone: [phone]   Fax:  [phone]  Physical Therapy Treatment  Patient Details  Name: [name] MRN: [id number] Date of Birth: [birth year] Referring Provider (PT): Earnie Larsson    Encounter Date: 10/30/2019  PT End of Session - 10/30/19 1536    Visit Number  4    Number of Visits  8    Date for PT Re-Evaluation  11/18/19    Authorization Type  BCBS    Authorization Time Period  10/26-->11/18/19    PT Start Time  1528    PT Stop Time  1612    PT Time Calculation (min)  44 min    Activity Tolerance  Patient tolerated treatment well    Behavior During Therapy  [facility] for tasks assessed/performed       Past Medical History:  Diagnosis Date  . Arthritis    hands  . Borderline glaucoma   . Borderline glaucoma of both eyes   . Breast fibrocystic disorder   . Deviated septum    residual post septorhinoplasty yrs ago  . Endometrial polyp   . GERD (gastroesophageal reflux disease)   . History of adenomatous polyp of colon    2004;  2014 tubular adenoma  . History of hiatal hernia   . Hypertension   . Osteoporosis   . Vocal nodules in adults    per pt hx nodules due to severe acid reflux  s/p  nissen fundoplication 0000000  and in 2008 seen by dr Sherre Lain and recieved speech therapy    Past Surgical History:  Procedure Laterality Date  . [place]  . COLONOSCOPY  last one 05-07-2013  . D & C HYSTEROSCOPY POLYPECTOMY  04-15-2004   dr Ashley Murrain  . DILATATION & CURETTAGE/HYSTEROSCOPY WITH MYOSURE N/A 01/20/2017   Procedure: DILATATION & CURETTAGE/HYSTEROSCOPY WITH MYOSURE;  Surgeon: Terrance Mass, MD;  Location: [facility];  Service: Gynecology;  Laterality: N/A;  . ESOPHAGOGASTRODUODENOSCOPY  last one 07-29-2008  . NISSEN FUNDOPLICATION  0000000  approx.  . ORIF CALCANEOUS FRACTURE Left 01/15/2016   Procedure: OPEN REDUCTION INTERNAL  FIXATION (ORIF) LEFT CALCANEOUS FRACTURE;  Surgeon: Marybelle Killings, MD;  Location: [facility];  Service: Orthopedics;  Laterality: Left;  . SEPTORHINOPLASTY  1980 approx  . TONSILLECTOMY  age 49    There were no vitals filed for this visit.  Subjective Assessment - 10/30/19 1528    Subjective  Pt stated she has increased pain today, played Bridge today for 3 hours and increased pain on posterior Lt neck pain.  No reoprts of radicular symptoms currently.  Pain scale 4-5/10    Pertinent History  osteoporosis, OA    Patient Stated Goals  less pain, less headaches, to be able to move her head more easily.    Currently in Pain?  Yes    Pain Score  4     Pain Location  Neck    Pain Orientation  Posterior;Left    Pain Descriptors / Indicators  Tingling    Pain Type  Chronic pain    Pain Onset  More than a month ago    Pain Frequency  Constant    Aggravating Factors   driving    Pain Relieving Factors  steroids    Effect of Pain on Daily Activities  limits         [facility] PT Assessment - 10/30/19 0001  Assessment   Medical Diagnosis  cervial radiculopathy    Referring Provider (PT)  Earnie Larsson     Onset Date/Surgical Date  09/16/19    Hand Dominance  Right    Next MD Visit  11/06/2019    Prior Therapy  yes in the past                    [facility] Adult PT Treatment/Exercise - 10/30/19 0001      Exercises   Exercises  Neck      Neck Exercises: Standing   Other Standing Exercises  scapular retract/protract with chin tuck      Neck Exercises: Seated   X to V  15 reps    W Back  10 reps    W Back Limitations  5"  reports some tingling that reduced wiht scapu    Other Seated Exercise  cervical flexion/extension, x10 reps; cervical rotation R/L, x10 reps; cervical sidebending R/L, x10 reps      Neck Exercises: Supine   Neck Retraction  15 reps    Neck Retraction Limitations  3 sets with manual traction between to improve tingling sensation      Neck Exercises: Prone   Neck  Retraction  10 reps;3 secs    Neck Retraction Limitations  chin tuck, head lift    Shoulder Extension  10 reps    Rows  10 reps    Rows Limitations  reports some tingling down to fingers, decrease with reps    Other Prone Exercise  quadruped scapular push ups, x15 reps; quadruped opposite UE raise, x10 reps each arm      Manual Therapy   Manual Therapy  Manual Traction;Soft tissue mobilization    Manual therapy comments  completed seperate from all other aspects of treatment    Soft tissue mobilization  pt supine, manual STM to L upper trap and cervical paraspinals to decrease spasm and pain    Manual Traction  Manual traction with 3x1 min hold               PT Short Term Goals - 10/23/19 1402      PT SHORT TERM GOAL #1   Title  PT to no longer have any sx of numbing going down into her LT arm to demonstrate decrease nerve root irritation.    Time  2    Period  Weeks    Status  On-going    Target Date  11/04/19      PT SHORT TERM GOAL #2   Title  Pt to be I in HEP to decrease nerve irritation to allow cervical pain to be no greater than a 5/10 to allow pt to complete ADL's with decreased discomfort.    Time  2    Period  Weeks    Status  On-going        PT Long Term Goals - 10/23/19 1402      PT LONG TERM GOAL #1   Title  PT cervical strength to be 5/5 to be able to demonstrate the ability to stabilize her cervical area while lifting ten pounds,(ie stack of dinner plates), into a cabinet at shoulder height without increased pain.    Time  4    Period  Weeks    Status  On-going      PT LONG TERM GOAL #2   Title  PT cervical pain to be no greater than a 2/10 while driving for a 30 minutes period.  Time  4    Period  Weeks    Status  On-going      PT LONG TERM GOAL #3   Title  PT to be abel to rotate her neck 80 degrees to the left in order to see in her blind spot while driving.    Time  4    Period  Weeks    Status  On-going            Plan -  10/30/19 1720    Clinical Impression Statement  Pt stated noted increased neck pain and some tingling with forward head position, pt stated she is becoming more aware of posture and compliant wiht HEP.  Added postural strengthening in prone position wiht good form following initial position.  EOS wiht manual STM to Lt upper trap and cervical paraspinals to address spasms, pt reports some increased tingling wiht palpation though reports relief following manual.  EOS reports minimal pain and no radicular symptoms following manual.    Examination-Activity Limitations  Carry;Lift;Sit;Sleep;Locomotion Level    Examination-Participation Restrictions  --   Does everything, just deals with the pain   Stability/Clinical Decision Making  Stable/Uncomplicated    Clinical Decision Making  Low    Rehab Potential  Good    PT Frequency  2x / week    PT Duration  4 weeks    PT Treatment/Interventions  ADLs/Self Care Home Management;Patient/family education;Manual techniques;Therapeutic activities;Therapeutic exercise    PT Next Visit Plan  Progress resistance and reps as able with postural strengthening exercises. Continue manual STM and manual cervical traction for symptoms relief.    PT Home Exercise Plan  cervical excursion, retraction, scapular retraction; 11/2: rows, shoulder extension, and quadruped scap protraction/retraction       Patient will benefit from skilled therapeutic intervention in order to improve the following deficits and impairments:  Decreased range of motion, Decreased strength, Increased muscle spasms, Pain, Improper body mechanics  Visit Diagnosis: Radiculopathy, cervical region     Problem List Patient Active Problem List   Diagnosis Date Noted  . Post-menopause 12/30/2016  . Calcaneal fracture 01/15/2016  . History of ovarian cyst 01/21/2013  . Cervicalgia 04/12/2012  . Breast fibrocystic disorder   . Endometrial polyp   . Osteoporosis   . Colon polyps   . Glaucoma   .  ANXIETY STATE, UNSPECIFIED 07/03/2008  . INTERNAL HEMORRHOIDS 07/03/2008  . ESOPHAGEAL REFLUX 07/03/2008  . DYSPEPSIA 07/03/2008  . COLONIC POLYPS, ADENOMATOUS, HX OF 07/03/2008   Ihor Austin, LPTA; CBIS [phone]  Aldona Lento 10/30/2019, 5:28 PM  [place], [place], [address] Phone: [phone]   Fax:  [phone]  Name: DEZYRE HAGIN MRN: [id number] Date of Birth: [birth year]

## 2019-11-04 ENCOUNTER — Encounter (HOSPITAL_COMMUNITY): Payer: Self-pay | Admitting: Physical Therapy

## 2019-11-04 ENCOUNTER — Ambulatory Visit (HOSPITAL_COMMUNITY): Payer: BC Managed Care – PPO | Admitting: Physical Therapy

## 2019-11-04 ENCOUNTER — Other Ambulatory Visit: Payer: Self-pay

## 2019-11-04 DIAGNOSIS — M5412 Radiculopathy, cervical region: Secondary | ICD-10-CM | POA: Diagnosis not present

## 2019-11-04 NOTE — Therapy (Signed)
[place], [place], [address] Phone: [phone]   Fax:  [phone]  Physical Therapy Treatment  Patient Details  Name: [name] MRN: [id number] Date of Birth: [birth year] Referring Provider (PT): Earnie Larsson    Encounter Date: 11/04/2019  PT End of Session - 11/04/19 1637    Visit Number  5    Number of Visits  8    Date for PT Re-Evaluation  11/18/19    Authorization Type  BCBS    Authorization Time Period  10/26-->11/18/19    PT Start Time  1449    PT Stop Time  1530    PT Time Calculation (min)  41 min    Activity Tolerance  Patient tolerated treatment well    Behavior During Therapy  [facility] for tasks assessed/performed       Past Medical History:  Diagnosis Date  . Arthritis    hands  . Borderline glaucoma   . Borderline glaucoma of both eyes   . Breast fibrocystic disorder   . Deviated septum    residual post septorhinoplasty yrs ago  . Endometrial polyp   . GERD (gastroesophageal reflux disease)   . History of adenomatous polyp of colon    2004;  2014 tubular adenoma  . History of hiatal hernia   . Hypertension   . Osteoporosis   . Vocal nodules in adults    per pt hx nodules due to severe acid reflux  s/p  nissen fundoplication 0000000  and in 2008 seen by dr Sherre Lain and recieved speech therapy    Past Surgical History:  Procedure Laterality Date  . [place]  . COLONOSCOPY  last one 05-07-2013  . D & C HYSTEROSCOPY POLYPECTOMY  04-15-2004   dr Ashley Murrain  . DILATATION & CURETTAGE/HYSTEROSCOPY WITH MYOSURE N/A 01/20/2017   Procedure: DILATATION & CURETTAGE/HYSTEROSCOPY WITH MYOSURE;  Surgeon: Terrance Mass, MD;  Location: [facility];  Service: Gynecology;  Laterality: N/A;  . ESOPHAGOGASTRODUODENOSCOPY  last one 07-29-2008  . NISSEN FUNDOPLICATION  0000000  approx.  . ORIF CALCANEOUS FRACTURE Left 01/15/2016   Procedure: OPEN REDUCTION INTERNAL  FIXATION (ORIF) LEFT CALCANEOUS FRACTURE;  Surgeon: Marybelle Killings, MD;  Location: [place];  Service: Orthopedics;  Laterality: Left;  . SEPTORHINOPLASTY  1980 approx  . TONSILLECTOMY  age 63    There were no vitals filed for this visit.  Subjective Assessment - 11/04/19 1448    Subjective  PT states that her pain comes and goes; the mornings are worse.    Pertinent History  osteoporosis, OA    Limitations  Reading;Lifting;House hold activities    How long can you sit comfortably?  no more than 5 minutes prior to the steroid.    How long can you stand comfortably?  less pain with standing    How long can you walk comfortably?  prior to steroid her neck would hurt right away and progress after about a mile.    Patient Stated Goals  less pain, less headaches, to be able to move her head more easily.    Currently in Pain?  Yes    Pain Score  1     Pain Location  Neck    Pain Orientation  Lower    Pain Descriptors / Indicators  Aching    Pain Type  Chronic pain    Pain Onset  More than a month ago    Pain Frequency  Intermittent    Aggravating Factors   car and sleepiing                       [facility] Adult PT Treatment/Exercise - 11/04/19 0001      Exercises   Exercises  Neck      Neck Exercises: Theraband   Scapula Retraction  10 reps    Scapula Retraction Limitations  GRN    Shoulder Extension  10 reps    Shoulder Extension Limitations  GRN    Rows  10 reps    Rows Limitations  GRN      Neck Exercises: Seated   W Back  10 reps    W Back Weights (lbs)  2    Money  5 reps      Neck Exercises: Supine   Upper Extremity Flexion with Stabilization  5 reps    UE Flexion with Stabilization Limitations  with chest press while stabilizing cervical area.       Neck Exercises: Prone   Other Prone Exercise  quadruped mad cat/old horse ; quadruped opposite UE raise, x10 reps each arm      Manual Therapy   Manual Therapy  Manual Traction;Soft tissue mobilization    Manual  therapy comments  completed seperate from all other aspects of treatment    Soft tissue mobilization  pt supine, manual STM to L upper trap and cervical paraspinals to decrease spasm and pain    Manual Traction  Manual traction with 3x1 min hold               PT Short Term Goals - 10/23/19 1402      PT SHORT TERM GOAL #1   Title  PT to no longer have any sx of numbing going down into her LT arm to demonstrate decrease nerve root irritation.    Time  2    Period  Weeks    Status  On-going    Target Date  11/04/19      PT SHORT TERM GOAL #2   Title  Pt to be I in HEP to decrease nerve irritation to allow cervical pain to be no greater than a 5/10 to allow pt to complete ADL's with decreased discomfort.    Time  2    Period  Weeks    Status  On-going        PT Long Term Goals - 10/23/19 1402      PT LONG TERM GOAL #1   Title  PT cervical strength to be 5/5 to be able to demonstrate the ability to stabilize her cervical area while lifting ten pounds,(ie stack of dinner plates), into a cabinet at shoulder height without increased pain.    Time  4    Period  Weeks    Status  On-going      PT LONG TERM GOAL #2   Title  PT cervical pain to be no greater than a 2/10 while driving for a 30 minutes period.    Time  4    Period  Weeks    Status  On-going      PT LONG TERM GOAL #3   Title  PT to be abel to rotate her neck 80 degrees to the left in order to see in her blind spot while driving.    Time  4    Period  Weeks    Status  On-going  Plan - 11/04/19 1637    Clinical Impression Statement  Added postural tband exercises while stabilizing neck.  Pt with moderate spasms in mid trap area B which were decreased with manual.  Added mad cat, old horse to improve thoracic and cervical mobility.    Examination-Activity Limitations  Carry;Lift;Sit;Sleep;Locomotion Level    Examination-Participation Restrictions  --   Does everything, just deals with the pain    Stability/Clinical Decision Making  Stable/Uncomplicated    Rehab Potential  Good    PT Frequency  2x / week    PT Duration  4 weeks    PT Treatment/Interventions  ADLs/Self Care Home Management;Patient/family education;Manual techniques;Therapeutic activities;Therapeutic exercise    PT Next Visit Plan  give Tband and postural exercises for HEP. Continue manual STM and manual cervical traction for symptoms relief.    PT Home Exercise Plan  cervical excursion, retraction, scapular retraction; 11/2: rows, shoulder extension, and quadruped scap protraction/retraction       Patient will benefit from skilled therapeutic intervention in order to improve the following deficits and impairments:  Decreased range of motion, Decreased strength, Increased muscle spasms, Pain, Improper body mechanics  Visit Diagnosis: Radiculopathy, cervical region     Problem List Patient Active Problem List   Diagnosis Date Noted  . Post-menopause 12/30/2016  . Calcaneal fracture 01/15/2016  . History of ovarian cyst 01/21/2013  . Cervicalgia 04/12/2012  . Breast fibrocystic disorder   . Endometrial polyp   . Osteoporosis   . Colon polyps   . Glaucoma   . ANXIETY STATE, UNSPECIFIED 07/03/2008  . INTERNAL HEMORRHOIDS 07/03/2008  . ESOPHAGEAL REFLUX 07/03/2008  . DYSPEPSIA 07/03/2008  . COLONIC POLYPS, ADENOMATOUS, HX OF 07/03/2008    Rayetta Humphrey, PT CLT [phone] 11/04/2019, 4:43 PM  [place] [address] [place], [place], [address] Phone: [phone]   Fax:  [phone]  Name: [name] MRN: [id number] Date of Birth: 05-Feb-1956

## 2020-02-10 ENCOUNTER — Other Ambulatory Visit: Payer: Self-pay

## 2020-02-10 ENCOUNTER — Ambulatory Visit: Payer: BC Managed Care – PPO | Attending: Internal Medicine

## 2020-02-10 DIAGNOSIS — Z20822 Contact with and (suspected) exposure to covid-19: Secondary | ICD-10-CM

## 2020-02-11 ENCOUNTER — Telehealth: Payer: Self-pay | Admitting: *Deleted

## 2020-02-11 LAB — NOVEL CORONAVIRUS, NAA: SARS-CoV-2, NAA: NOT DETECTED

## 2020-02-11 NOTE — Telephone Encounter (Signed)
She called in requesting her COVID-19 test result.   I let her know it was not detected meaning she did not have the virus.  She thanked me for my help.

## 2020-02-27 ENCOUNTER — Ambulatory Visit (INDEPENDENT_AMBULATORY_CARE_PROVIDER_SITE_OTHER): Payer: BC Managed Care – PPO | Admitting: Cardiovascular Disease

## 2020-02-27 ENCOUNTER — Encounter: Payer: Self-pay | Admitting: Cardiovascular Disease

## 2020-02-27 ENCOUNTER — Other Ambulatory Visit: Payer: Self-pay

## 2020-02-27 VITALS — BP 120/78 | HR 104 | Ht 65.0 in | Wt 112.0 lb

## 2020-02-27 DIAGNOSIS — I10 Essential (primary) hypertension: Secondary | ICD-10-CM | POA: Diagnosis not present

## 2020-02-27 DIAGNOSIS — Z01818 Encounter for other preprocedural examination: Secondary | ICD-10-CM

## 2020-02-27 NOTE — Patient Instructions (Addendum)
Medication Instructions:  Continue all current medications.  Labwork: none  Testing/Procedures: none  Follow-Up: As needed.    Any Other Special Instructions Will Be Listed Below (If Applicable).  If you need a refill on your cardiac medications before your next appointment, please call your pharmacy.  

## 2020-02-27 NOTE — Progress Notes (Signed)
SUBJECTIVE: The patient presents for preoperative risk stratification for nasal surgery.  I saw her for the same reason in October 2019.  She ultimately did not undergo nasal surgery at that time but plans to undergo surgery on 03/12/2020.  Her husband, Ronalee Belts, is also my patient.  She has insomnia and takes Unisom at night.  She also has nightmares.  She can occasionally abruptly wake up at night but this is seldom in occurrence.  She has been on a keto diet since May 2020 and has lost 40 pounds.  The patient denies any symptoms of chest pain, palpitations, shortness of breath, lightheadedness, dizziness, leg swelling, orthopnea, PND, and syncope.  ECG performed in the office today which I ordered and personally interpreted demonstrates normal sinus rhythm with no ischemic ST segment or T-wave abnormalities, nor any arrhythmias.  She exercises 5 to 6 days/week for 60 minutes each time.  I reviewed labs which she brought in dated 09/07/2019: Total cholesterol 202, triglycerides XT 8, HDL 75, LDL 115, TSH 3.04, vitamin D 45, sodium 141, potassium 4.4, chloride 101, bicarb 23, BUN 15, creatinine 0.67, normal LFTs.  She had other questions about taking the COVID-19 vaccine as well as questions about propofol administration for her upcoming surgery.  Social history: She is a retired Education officer, museum.  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  She retired at age 38.  Review of Systems: As per "subjective", otherwise negative.  Allergies  Allergen Reactions  . Prednisone Other (See Comments)    Mental status change and vaginal bleed  . Other Itching    Oxycodone/Hydrocodone causes itching    Current Outpatient Medications  Medication Sig Dispense Refill  . ALPRAZolam (XANAX) 0.25 MG tablet Take 1 tablet (0.25 mg total) by mouth as needed for anxiety or sleep. 30 tablet 1  . B Complex-C (B-COMPLEX WITH VITAMIN C) tablet Take 1 tablet by mouth daily.     . Calcium Carbonate Antacid (TUMS PO) Take by mouth as needed.    . Calcium Citrate-Vitamin D (EQ CALCIUM CITRATE+D3 PO) Take 1 tablet by mouth daily.    Marland Kitchen denosumab (PROLIA) 60 MG/ML SOSY injection Inject 60 mg into the skin every 6 (six) months.    . diphenhydramine-acetaminophen (TYLENOL PM) 25-500 MG TABS tablet Take 1 tablet by mouth at bedtime as needed.    . doxylamine, Sleep, (UNISOM) 25 MG tablet Take 12.5-25 mg by mouth at bedtime as needed for sleep.    Marland Kitchen lisinopril (PRINIVIL,ZESTRIL) 10 MG tablet Take 10 mg by mouth every morning.     . Multiple Vitamins-Minerals (CENTRUM SILVER 50+WOMEN) TABS Take 1 tablet by mouth daily.     . Omega-3 Fatty Acids (FISH OIL) 1000 MG CAPS Take 1 capsule by mouth daily.    Marland Kitchen omeprazole (PRILOSEC) 20 MG capsule Take 20 mg by mouth as needed.     No current facility-administered medications for this visit.    Past Medical History:  Diagnosis Date  . Arthritis    hands  . Borderline glaucoma   . Borderline glaucoma of both eyes   . Breast fibrocystic disorder   . Deviated septum    residual post septorhinoplasty yrs ago  . Endometrial polyp   . GERD (gastroesophageal reflux disease)   . History of adenomatous polyp of colon    2004;  2014 tubular adenoma  . History of hiatal hernia   . Hypertension   . Osteoporosis   .  Vocal nodules in adults    per pt hx nodules due to severe acid reflux  s/p  nissen fundoplication 0000000  and in 2008 seen by dr Sherre Lain and recieved speech therapy    Past Surgical History:  Procedure Laterality Date  . [place]  . COLONOSCOPY  last one 05-07-2013  . D & C HYSTEROSCOPY POLYPECTOMY  04-15-2004   dr Ashley Murrain  . DILATATION & CURETTAGE/HYSTEROSCOPY WITH MYOSURE N/A 01/20/2017   Procedure: DILATATION & CURETTAGE/HYSTEROSCOPY WITH MYOSURE;  Surgeon: Terrance Mass, MD;  Location: [facility];  Service: Gynecology;  Laterality: N/A;  . ESOPHAGOGASTRODUODENOSCOPY   last one 07-29-2008  . NISSEN FUNDOPLICATION  0000000  approx.  . ORIF CALCANEOUS FRACTURE Left 01/15/2016   Procedure: OPEN REDUCTION INTERNAL FIXATION (ORIF) LEFT CALCANEOUS FRACTURE;  Surgeon: Marybelle Killings, MD;  Location: [facility];  Service: Orthopedics;  Laterality: Left;  . SEPTORHINOPLASTY  1980 approx  . TONSILLECTOMY  age 62    Social History   Socioeconomic History  . Marital status: Married    Spouse name: Not on file  . Number of children: Not on file  . Years of education: Not on file  . Highest education level: Not on file  Occupational History  . Not on file  Tobacco Use  . Smoking status: Never Smoker  . Smokeless tobacco: Never Used  Substance and Sexual Activity  . Alcohol use: Yes    Alcohol/week: 10.0 standard drinks    Types: 4 Glasses of wine, 6 Standard drinks or equivalent per week  . Drug use: No  . Sexual activity: Yes    Comment: VASECTOMY  Other Topics Concern  . Not on file  Social History Narrative  . Not on file   Social Determinants of Health   Financial Resource Strain:   . Difficulty of Paying Living Expenses: Not on file  Food Insecurity:   . Worried About Charity fundraiser in the Last Year: Not on file  . Ran Out of Food in the Last Year: Not on file  Transportation Needs:   . Lack of Transportation (Medical): Not on file  . Lack of Transportation (Non-Medical): Not on file  Physical Activity:   . Days of Exercise per Week: Not on file  . Minutes of Exercise per Session: Not on file  Stress:   . Feeling of Stress : Not on file  Social Connections:   . Frequency of Communication with Friends and Family: Not on file  . Frequency of Social Gatherings with Friends and Family: Not on file  . Attends Religious Services: Not on file  . Active Member of Clubs or Organizations: Not on file  . Attends Archivist Meetings: Not on file  . Marital Status: Not on file  Intimate Partner Violence:   . Fear of Current or Ex-Partner: Not on  file  . Emotionally Abused: Not on file  . Physically Abused: Not on file  . Sexually Abused: Not on file    Orson Slick, LPN was present throughout the entirety of the encounter.  Vitals:   02/27/20 1524  BP: 120/78  Pulse: (!) 104  SpO2: 98%  Weight: 112 lb (50.8 kg)  Height: 5\' 5"  (1.651 m)    Wt Readings from Last 3 Encounters:  02/27/20 112 lb (50.8 kg)  10/24/18 155 lb 6.4 oz (70.5 kg)  02/03/17 144 lb (65.3 kg)     PHYSICAL EXAM General: NAD HEENT: Normal. Neck:  No JVD, no thyromegaly. Lungs: Clear to auscultation bilaterally with normal respiratory effort. CV: Regular rate and rhythm, normal S1/S2, no S3/S4, no murmur. No pretibial or periankle edema.  No carotid bruit.   Abdomen: Soft, nontender, no distention.  Neurologic: Alert and oriented.  Psych: Normal affect. Skin: Normal. Musculoskeletal: No gross deformities.      Labs: Lab Results  Component Value Date/Time   K 4.0 01/19/2017 12:06 PM   BUN 11 01/19/2017 12:06 PM   CREATININE 0.76 01/19/2017 12:06 PM   CREATININE 0.65 12/12/2013 12:18 PM   ALT 22 01/15/2016 09:11 AM   TSH 1.324 12/12/2013 12:18 PM   HGB 14.2 01/19/2017 12:06 PM     Lipids: No results found for: LDLCALC, LDLDIRECT, CHOL, TRIG, HDL     ASSESSMENT AND PLAN:  1.  Preoperative risk stratification: She denies anginal symptoms altogether.  Labs are reviewed above and found to be within normal limits.  ECG is normal.  She can proceed with nasal surgery as planned with a low risk for major adverse cardiac events.  2.  Hypertension: BP is normal.  No changes to therapy.    Disposition: Follow up as needed  Time spent: 40 minutes, of which greater than 50% was spent reviewing symptoms, relevant blood tests and studies, and discussing management plan with the patient.    Kate Sable, M.D., F.A.C.C.

## 2020-03-09 ENCOUNTER — Ambulatory Visit: Payer: BC Managed Care – PPO | Attending: Internal Medicine

## 2020-03-09 ENCOUNTER — Other Ambulatory Visit: Payer: Self-pay

## 2020-03-09 DIAGNOSIS — Z20822 Contact with and (suspected) exposure to covid-19: Secondary | ICD-10-CM

## 2020-03-10 LAB — NOVEL CORONAVIRUS, NAA: SARS-CoV-2, NAA: NOT DETECTED

## 2020-03-30 ENCOUNTER — Other Ambulatory Visit: Payer: Self-pay

## 2020-03-30 ENCOUNTER — Ambulatory Visit: Payer: BC Managed Care – PPO | Attending: Internal Medicine

## 2020-03-30 DIAGNOSIS — Z20822 Contact with and (suspected) exposure to covid-19: Secondary | ICD-10-CM

## 2020-04-01 LAB — SARS-COV-2, NAA 2 DAY TAT

## 2020-04-01 LAB — NOVEL CORONAVIRUS, NAA: SARS-CoV-2, NAA: NOT DETECTED

## 2020-05-11 ENCOUNTER — Ambulatory Visit: Payer: BC Managed Care – PPO | Attending: Internal Medicine

## 2020-05-11 DIAGNOSIS — Z23 Encounter for immunization: Secondary | ICD-10-CM

## 2020-05-11 NOTE — Progress Notes (Signed)
   Covid-19 Vaccination Clinic  Name:  [name]    MRN: [id number] DOB: 03/30/1956  05/11/2020  [name] was observed post Covid-19 immunization for 15 minutes without incident. She was provided with Vaccine Information Sheet and instruction to access the V-Safe system.   [name] was instructed to call 911 with any severe reactions post vaccine: Marland Kitchen Difficulty breathing  . Swelling of face and throat  . A fast heartbeat  . A bad rash all over body  . Dizziness and weakness   Immunizations Administered    Name Date Dose VIS Date Route   Pfizer COVID-19 Vaccine 05/11/2020  3:37 PM 0.3 mL 02/19/2019 Intramuscular   Manufacturer: [place]   Lot: [id number]   [place]: [id number]

## 2020-06-01 ENCOUNTER — Ambulatory Visit: Payer: BC Managed Care – PPO | Attending: Internal Medicine

## 2020-06-01 DIAGNOSIS — Z23 Encounter for immunization: Secondary | ICD-10-CM

## 2020-06-01 NOTE — Progress Notes (Signed)
   Covid-19 Vaccination Clinic  Name:  [name]    MRN: [id number] DOB: 06-06-1956  06/01/2020  [name] was observed post Covid-19 immunization for 15 minutes without incident. She was provided with Vaccine Information Sheet and instruction to access the V-Safe system.   [name] was instructed to call 911 with any severe reactions post vaccine: Marland Kitchen Difficulty breathing  . Swelling of face and throat  . A fast heartbeat  . A bad rash all over body  . Dizziness and weakness   Immunizations Administered    Name Date Dose VIS Date Route   Pfizer COVID-19 Vaccine 06/01/2020  1:42 PM 0.3 mL 02/19/2019 Intramuscular   Manufacturer: [employer]   Lot: [id number]   [place]: [id number]

## 2020-06-15 NOTE — Progress Notes (Signed)
[facility] Note  06/17/2020     CHIEF COMPLAINT Patient presents for Retina Evaluation   HISTORY OF PRESENT ILLNESS: [name] is a 64 y.o. female who presents to the clinic today for:   HPI    Retina Evaluation    In right eye.  This started 7 weeks ago.  Duration of 7 weeks.  Associated Symptoms Flashes and Floaters.  I, the attending physician,  performed the HPI with the patient and updated documentation appropriately.          Comments    Patient here for retina evaluation. Referred by Dr. Kathlen Mody. Patient states vision doing pretty good. On May 3rd. Had flashing lights, floaters and a film over vision . No eye pain. Has FB sensation on occasion. Had seen regular eye dr. Then saw ophthalmologist.then recommend here. Hx glaucoma gtts.  Glaucoma specialist took her off of them because she has elevated IOPs but thick corneas per pt.       Last edited by Bernarda Caffey, MD on 06/17/2020 12:33 PM. (History)    pt states she has been seeing flashes of light for about a month, she states she also see a film in her vision, she feels like she sees the film more so when driving long distances, she states the flashes happen at different times during the day, she states they are normally single flashes over several minutes, she states they are very inconsistent, pt has seen Dr. Jorja Loa as well as Dr. Kathlen Mody who both told her the jelly in the back of her eye has detached, but neither one of them found a tear  Referring physician: Marshall Cork, MD [place], [place] [address]   HISTORICAL INFORMATION:   Selected notes from the MEDICAL RECORD NUMBER Retinal evaluation per Dr. Kathlen Mody -- PVD OD  LEE:06.08.21 BCVA OD: 20/20-1 OS: 20/25 PMH: HTN, osteoparosis      CURRENT MEDICATIONS: No current outpatient medications on file. (Ophthalmic Drugs)   No current facility-administered medications for this visit. (Ophthalmic Drugs)    Current Outpatient Medications (Other)  Medication Sig  . ALPRAZolam (XANAX) 0.25 MG tablet Take 1 tablet (0.25 mg total) by mouth as needed for anxiety or sleep.  . B Complex-C (B-COMPLEX WITH VITAMIN C) tablet Take 1 tablet by mouth daily.  . Calcium Carbonate Antacid (TUMS PO) Take by mouth as needed.  . Calcium Citrate-Vitamin D (EQ CALCIUM CITRATE+D3 PO) Take 1 tablet by mouth daily.  Marland Kitchen denosumab (PROLIA) 60 MG/ML SOSY injection Inject 60 mg into the skin every 6 (six) months.  . diphenhydramine-acetaminophen (TYLENOL PM) 25-500 MG TABS tablet Take 1 tablet by mouth at bedtime as needed.  . doxylamine, Sleep, (UNISOM) 25 MG tablet Take 12.5-25 mg by mouth at bedtime as needed for sleep.  Marland Kitchen lisinopril (PRINIVIL,ZESTRIL) 10 MG tablet Take 10 mg by mouth every morning.   . Multiple Vitamins-Minerals (CENTRUM SILVER 50+WOMEN) TABS Take 1 tablet by mouth daily.   . Omega-3 Fatty Acids (FISH OIL) 1000 MG CAPS Take 1 capsule by mouth daily.  Marland Kitchen omeprazole (PRILOSEC) 20 MG capsule Take 20 mg by mouth as needed.   No current facility-administered medications for this visit. (Other)      REVIEW OF SYSTEMS: ROS    Positive for: Gastrointestinal, Neurological, Eyes   Last edited by Leonie Douglas, COA on 06/17/2020  9:23 AM. (History)       ALLERGIES Allergies  Allergen Reactions  . Prednisone Other (See  Comments)    Mental status change and vaginal bleed  . Other Itching    Oxycodone/Hydrocodone causes itching    PAST MEDICAL HISTORY Past Medical History:  Diagnosis Date  . Arthritis    hands  . Borderline glaucoma   . Borderline glaucoma of both eyes   . Breast fibrocystic disorder   . Deviated septum    residual post septorhinoplasty yrs ago  . Endometrial polyp   . GERD (gastroesophageal reflux disease)   . History of adenomatous polyp of colon    2004;  2014 tubular adenoma  . History of hiatal hernia   . Hypertension   . Osteoporosis   . Vocal nodules in adults     per pt hx nodules due to severe acid reflux  s/p  nissen fundoplication 6834  and in 2008 seen by dr Sherre Lain and recieved speech therapy   Past Surgical History:  Procedure Laterality Date  . [place]  . COLONOSCOPY  last one 05-07-2013  . D & C HYSTEROSCOPY POLYPECTOMY  04-15-2004   dr Ashley Murrain  . DILATATION & CURETTAGE/HYSTEROSCOPY WITH MYOSURE N/A 01/20/2017   Procedure: DILATATION & CURETTAGE/HYSTEROSCOPY WITH MYOSURE;  Surgeon: Terrance Mass, MD;  Location: [facility];  Service: Gynecology;  Laterality: N/A;  . ESOPHAGOGASTRODUODENOSCOPY  last one 07-29-2008  . NISSEN FUNDOPLICATION  1962  approx.  . ORIF CALCANEOUS FRACTURE Left 01/15/2016   Procedure: OPEN REDUCTION INTERNAL FIXATION (ORIF) LEFT CALCANEOUS FRACTURE;  Surgeon: Marybelle Killings, MD;  Location: [facility];  Service: Orthopedics;  Laterality: Left;  . SEPTORHINOPLASTY  1980 approx  . TONSILLECTOMY  age 70    FAMILY HISTORY Family History  Problem Relation Age of Onset  . Diabetes Father   . Hypertension Father   . Heart disease Father   . Diabetes Paternal Grandfather   . Heart disease Paternal Grandfather   . Diabetes Paternal Uncle     SOCIAL HISTORY Social History   Tobacco Use  . Smoking status: Never Smoker  . Smokeless tobacco: Never Used  Substance Use Topics  . Alcohol use: Yes    Alcohol/week: 10.0 standard drinks    Types: 4 Glasses of wine, 6 Standard drinks or equivalent per week  . Drug use: No         OPHTHALMIC EXAM:  Base Eye Exam    Visual Acuity (Snellen - Linear)      Right Left   Dist [place] 20/20 20/50 -2   Dist ph [place]  20/20       Tonometry (Tonopen, 9:30 AM)      Right Left   Pressure 21 20       Pupils      Dark Light Shape React APD   Right 3 2 Round Brisk None   Left 3 2 Round Brisk None       Visual Fields (Counting fingers)      Left Right    Full Full       Extraocular Movement      Right Left    Full, Ortho Full,  Ortho       Neuro/Psych    Oriented x3: Yes   Mood/Affect: Normal       Dilation    Both eyes: 1.0% Mydriacyl, 2.5% Phenylephrine @ 9:31 AM        Slit Lamp and Fundus Exam    Slit Lamp Exam      Right Left   Lids/Lashes Dermatochalasis -  upper lid Dermatochalasis - upper lid   Conjunctiva/Sclera nasal and temporal pinguecula nasal and temporal pinguecula   Cornea mild arcus, 1+ Punctate epithelial erosions mild arcus, 1+ Punctate epithelial erosions   Anterior Chamber Deep and quiet Deep and quiet   Iris Round and dilated Round and dilated   Lens 2+ Nuclear sclerosis, 2+ Cortical cataract 2+ Nuclear sclerosis, 2+ Cortical cataract   Vitreous Vitreous syneresis, no pigment, Posterior vitreous detachment Vitreous syneresis       Fundus Exam      Right Left   Disc Pink and Sharp, Compact Pink and Sharp, Compact   C/D Ratio 0.1 0.4   Macula Flat, Blunted foveal reflex, mild RPE mottling, No heme or edema Flat, Blunted foveal reflex, mild RPE mottling, No heme or edema   Vessels Mild Vascular attenuation Mild Vascular attenuation   Periphery Attached, mild peripheral cystoid degeneration, No RT/RD on 360 scleral depression  Attached, No RT/RD        Refraction    Manifest Refraction      Sphere Cylinder Axis Dist VA   Right +0.25 +0.25 175 20/20   Left +1.50 +0.50 005 20/20          IMAGING AND PROCEDURES  Imaging and Procedures for @TODAY @  OCT, Retina - OU - Both Eyes       Right Eye Quality was good. Central Foveal Thickness: 315. Progression has no prior data. Findings include normal foveal contour, no IRF, no SRF.   Left Eye Quality was good. Central Foveal Thickness: 304. Progression has no prior data. Findings include normal foveal contour, no IRF, no SRF (Partial PVD).   Notes *Images captured and stored on drive  Diagnosis / Impression:  NFP; no IRF/SRF OU  Clinical management:  See below  Abbreviations: NFP - Normal foveal profile. CME -  cystoid macular edema. PED - pigment epithelial detachment. IRF - intraretinal fluid. SRF - subretinal fluid. EZ - ellipsoid zone. ERM - epiretinal membrane. ORA - outer retinal atrophy. ORT - outer retinal tubulation. SRHM - subretinal hyper-reflective material. IRHM - intraretinal hyper-reflective material                 ASSESSMENT/PLAN:    ICD-10-CM   1. Posterior vitreous detachment of right eye  H43.811   2. Retinal edema  H35.81 OCT, Retina - OU - Both Eyes  3. Essential hypertension  I10   4. Hypertensive retinopathy of both eyes  H35.033   5. Combined forms of age-related cataract of both eyes  H25.813     1,2. PVD / vitreous syneresis OD  - onset of symptomatic flashes/floaters Apr 27 2020 -- mild persistent intermittent photopsias  - Discussed findings and prognosis  - No RT or RD on 360 scleral depressed exam  - Reviewed s/s of RT/RD  - Strict return precautions for any such RT/RD signs/symptoms  - f/u in 4-6 wks -- DFE/OCT  3,4. Hypertensive retinopathy OU - discussed importance of tight BP control - monitor  5. Mixed cataracts OU  - The symptoms of cataract, surgical options, and treatments and risks were discussed with patient.  - discussed diagnosis and progression  - not yet visually significant  - monitor for now   Ophthalmic Meds Ordered this visit:  No orders of the defined types were placed in this encounter.      Return for f/u 4-6 weeks, PVD OD, DFE, OCT.  There are no Patient Instructions on file for this visit.   Explained the diagnoses,  plan, and follow up with the patient and they expressed understanding.  Patient expressed understanding of the importance of proper follow up care.   This document serves as a record of services personally performed by Gardiner Sleeper, MD, PhD. It was created on their behalf by San Jetty. Owens Shark, COT, an ophthalmic technician. The creation of this record is the provider's dictation and/or activities during  the visit.    Electronically signed by: San Jetty. Owens Shark, [place] 06.23.2021 12:36 PM   Gardiner Sleeper, M.D., Ph.D. Diseases & Surgery of the Retina and Vitreous [facility]  I have reviewed the above documentation for accuracy and completeness, and I agree with the above. Gardiner Sleeper, M.D., Ph.D. 06/17/20 12:36 PM    Abbreviations: M myopia (nearsighted); A astigmatism; H hyperopia (farsighted); P presbyopia; Mrx spectacle prescription;  CTL contact lenses; OD right eye; OS left eye; OU both eyes  XT exotropia; ET esotropia; PEK punctate epithelial keratitis; PEE punctate epithelial erosions; DES dry eye syndrome; MGD meibomian gland dysfunction; ATs artificial tears; PFAT's preservative free artificial tears; [place] nuclear sclerotic cataract; PSC posterior subcapsular cataract; ERM epi-retinal membrane; PVD posterior vitreous detachment; RD retinal detachment; DM diabetes mellitus; DR diabetic retinopathy; NPDR non-proliferative diabetic retinopathy; PDR proliferative diabetic retinopathy; CSME clinically significant macular edema; DME diabetic macular edema; dbh dot blot hemorrhages; CWS cotton wool spot; POAG primary open angle glaucoma; C/D cup-to-disc ratio; HVF humphrey visual field; GVF goldmann visual field; OCT optical coherence tomography; IOP intraocular pressure; BRVO Branch retinal vein occlusion; CRVO central retinal vein occlusion; CRAO central retinal artery occlusion; BRAO branch retinal artery occlusion; RT retinal tear; SB scleral buckle; PPV pars plana vitrectomy; VH Vitreous hemorrhage; PRP panretinal laser photocoagulation; IVK intravitreal kenalog; VMT vitreomacular traction; MH Macular hole;  NVD neovascularization of the disc; NVE neovascularization elsewhere; AREDS age related eye disease study; ARMD age related macular degeneration; POAG primary open angle glaucoma; EBMD epithelial/anterior basement membrane dystrophy; ACIOL anterior chamber intraocular  lens; IOL intraocular lens; PCIOL posterior chamber intraocular lens; Phaco/IOL phacoemulsification with intraocular lens placement; [place] photorefractive keratectomy; LASIK laser assisted in situ keratomileusis; HTN hypertension; DM diabetes mellitus; COPD chronic obstructive pulmonary disease

## 2020-06-17 ENCOUNTER — Encounter (INDEPENDENT_AMBULATORY_CARE_PROVIDER_SITE_OTHER): Payer: Self-pay | Admitting: Ophthalmology

## 2020-06-17 ENCOUNTER — Ambulatory Visit (INDEPENDENT_AMBULATORY_CARE_PROVIDER_SITE_OTHER): Payer: BC Managed Care – PPO | Admitting: Ophthalmology

## 2020-06-17 ENCOUNTER — Other Ambulatory Visit: Payer: Self-pay

## 2020-06-17 DIAGNOSIS — H43811 Vitreous degeneration, right eye: Secondary | ICD-10-CM | POA: Diagnosis not present

## 2020-06-17 DIAGNOSIS — H3581 Retinal edema: Secondary | ICD-10-CM

## 2020-06-17 DIAGNOSIS — I10 Essential (primary) hypertension: Secondary | ICD-10-CM

## 2020-06-17 DIAGNOSIS — H35033 Hypertensive retinopathy, bilateral: Secondary | ICD-10-CM | POA: Diagnosis not present

## 2020-06-17 DIAGNOSIS — H25813 Combined forms of age-related cataract, bilateral: Secondary | ICD-10-CM

## 2020-07-20 ENCOUNTER — Ambulatory Visit (INDEPENDENT_AMBULATORY_CARE_PROVIDER_SITE_OTHER): Payer: BC Managed Care – PPO | Admitting: Ophthalmology

## 2020-07-20 ENCOUNTER — Encounter (INDEPENDENT_AMBULATORY_CARE_PROVIDER_SITE_OTHER): Payer: Self-pay | Admitting: Ophthalmology

## 2020-07-20 ENCOUNTER — Other Ambulatory Visit: Payer: Self-pay

## 2020-07-20 DIAGNOSIS — H43811 Vitreous degeneration, right eye: Secondary | ICD-10-CM | POA: Diagnosis not present

## 2020-07-20 DIAGNOSIS — I10 Essential (primary) hypertension: Secondary | ICD-10-CM

## 2020-07-20 DIAGNOSIS — H35033 Hypertensive retinopathy, bilateral: Secondary | ICD-10-CM | POA: Diagnosis not present

## 2020-07-20 DIAGNOSIS — H25813 Combined forms of age-related cataract, bilateral: Secondary | ICD-10-CM

## 2020-07-20 DIAGNOSIS — H3581 Retinal edema: Secondary | ICD-10-CM | POA: Diagnosis not present

## 2020-07-20 NOTE — Progress Notes (Addendum)
[facility] Note  07/20/2020     CHIEF COMPLAINT Patient presents for Retina Follow Up   HISTORY OF PRESENT ILLNESS: [name] is a 64 y.o. female who presents to the clinic today for:   HPI    Retina Follow Up    Patient presents with  PVD.  In right eye.  Duration of weeks.  Since onset it is rapidly worsening.  I, the attending physician,  performed the HPI with the patient and updated documentation appropriately.          Comments    4 week follow up PVD OD-  Vision has changed since last visit.  More when driving, reading road signs and reading the bottom of the TV screen.  She had a "stye" about 6 days after last appt.  That is when the vision changed.  She seen Dr. Kathlen Mody last Monday and he gave her a new Rx for distance only glasses which she has ordered.         Last edited by Bernarda Caffey, MD on 07/20/2020  8:58 AM. (History)    Pt wonders if stye was causing change in vision OS Referring physician: Marshall Cork, MD [place], [place] [address]  HISTORICAL INFORMATION:   Selected notes from the MEDICAL RECORD NUMBER Retinal evaluation per Dr. Kathlen Mody -- PVD OD  LEE:06.08.21 BCVA OD: 20/20-1 OS: 20/25 PMH: HTN, osteoparosis      CURRENT MEDICATIONS: No current outpatient medications on file. (Ophthalmic Drugs)   No current facility-administered medications for this visit. (Ophthalmic Drugs)   Current Outpatient Medications (Other)  Medication Sig  . ALPRAZolam (XANAX) 0.25 MG tablet Take 1 tablet (0.25 mg total) by mouth as needed for anxiety or sleep.  . B Complex-C (B-COMPLEX WITH VITAMIN C) tablet Take 1 tablet by mouth daily.  . Calcium Carbonate Antacid (TUMS PO) Take by mouth as needed.  . Calcium Citrate-Vitamin D (EQ CALCIUM CITRATE+D3 PO) Take 1 tablet by mouth daily.  Marland Kitchen denosumab (PROLIA) 60 MG/ML SOSY injection Inject 60 mg into the skin every 6 (six) months.  .  diphenhydramine-acetaminophen (TYLENOL PM) 25-500 MG TABS tablet Take 1 tablet by mouth at bedtime as needed.  . doxylamine, Sleep, (UNISOM) 25 MG tablet Take 12.5-25 mg by mouth at bedtime as needed for sleep.  Marland Kitchen lisinopril (PRINIVIL,ZESTRIL) 10 MG tablet Take 10 mg by mouth every morning.   . Multiple Vitamins-Minerals (CENTRUM SILVER 50+WOMEN) TABS Take 1 tablet by mouth daily.   . Omega-3 Fatty Acids (FISH OIL) 1000 MG CAPS Take 1 capsule by mouth daily.  Marland Kitchen omeprazole (PRILOSEC) 20 MG capsule Take 20 mg by mouth as needed.   No current facility-administered medications for this visit. (Other)      REVIEW OF SYSTEMS: ROS    Positive for: Gastrointestinal, Neurological, Eyes   Last edited by Leonie Douglas, COA on 07/20/2020  8:03 AM. (History)       ALLERGIES Allergies  Allergen Reactions  . Prednisone Other (See Comments)    Mental status change and vaginal bleed  . Other Itching    Oxycodone/Hydrocodone causes itching    PAST MEDICAL HISTORY Past Medical History:  Diagnosis Date  . Arthritis    hands  . Borderline glaucoma   . Borderline glaucoma of both eyes   . Breast fibrocystic disorder   . Deviated septum    residual post septorhinoplasty yrs ago  . Endometrial polyp   . GERD (gastroesophageal reflux disease)   .  History of adenomatous polyp of colon    2004;  2014 tubular adenoma  . History of hiatal hernia   . Hypertension   . Osteoporosis   . Vocal nodules in adults    per pt hx nodules due to severe acid reflux  s/p  nissen fundoplication 0960  and in 2008 seen by dr Sherre Lain and recieved speech therapy   Past Surgical History:  Procedure Laterality Date  . [place]  . COLONOSCOPY  last one 05-07-2013  . D & C HYSTEROSCOPY POLYPECTOMY  04-15-2004   dr Ashley Murrain  . DILATATION & CURETTAGE/HYSTEROSCOPY WITH MYOSURE N/A 01/20/2017   Procedure: DILATATION & CURETTAGE/HYSTEROSCOPY WITH MYOSURE;  Surgeon: Terrance Mass, MD;   Location: [facility];  Service: Gynecology;  Laterality: N/A;  . ESOPHAGOGASTRODUODENOSCOPY  last one 07-29-2008  . NISSEN FUNDOPLICATION  4540  approx.  . ORIF CALCANEOUS FRACTURE Left 01/15/2016   Procedure: OPEN REDUCTION INTERNAL FIXATION (ORIF) LEFT CALCANEOUS FRACTURE;  Surgeon: Marybelle Killings, MD;  Location: [facility];  Service: Orthopedics;  Laterality: Left;  . SEPTORHINOPLASTY  1980 approx  . TONSILLECTOMY  age 31    FAMILY HISTORY Family History  Problem Relation Age of Onset  . Diabetes Father   . Hypertension Father   . Heart disease Father   . Diabetes Paternal Grandfather   . Heart disease Paternal Grandfather   . Diabetes Paternal Uncle     SOCIAL HISTORY Social History   Tobacco Use  . Smoking status: Never Smoker  . Smokeless tobacco: Never Used  Substance Use Topics  . Alcohol use: Yes    Alcohol/week: 10.0 standard drinks    Types: 4 Glasses of wine, 6 Standard drinks or equivalent per week  . Drug use: No         OPHTHALMIC EXAM:  Base Eye Exam    Visual Acuity (Snellen - Linear)      Right Left   Dist [place] 20/20 -2 20/50 -2   Dist ph [place]  20/20 -2       Tonometry (Tonopen, 8:16 AM)      Right Left   Pressure 20 19       Pupils      Dark Light Shape React APD   Right 3 2 Round Brisk None   Left 3 2 Round Brisk None       Visual Fields (Counting fingers)      Left Right    Full Full       Extraocular Movement      Right Left    Full Full       Neuro/Psych    Oriented x3: Yes   Mood/Affect: Normal       Dilation    Both eyes: 1.0% Mydriacyl, 2.5% Phenylephrine @ 8:16 AM        Slit Lamp and Fundus Exam    Slit Lamp Exam      Right Left   Lids/Lashes Dermatochalasis - upper lid Dermatochalasis - upper lid   Conjunctiva/Sclera nasal and temporal pinguecula nasal and temporal pinguecula   Cornea mild arcus, 1-2+ Punctate epithelial erosions, tear film debris mild arcus, 1-2+ Punctate epithelial erosions, tear film  debris   Anterior Chamber Deep and quiet Deep and quiet   Iris Round and dilated Round and dilated   Lens 2+ Nuclear sclerosis, 2+ Cortical cataract 2+ Nuclear sclerosis, 2+ Cortical cataract   Vitreous Vitreous syneresis, no pigment, Posterior vitreous detachment Vitreous syneresis  Fundus Exam      Right Left   Disc Pink and Sharp, Compact Pink and Sharp, Compact   C/D Ratio 0.1 0.4   Macula Flat, Blunted foveal reflex, mild RPE mottling, No heme or edema Flat, Blunted foveal reflex, mild RPE mottling, No heme or edema   Vessels Mild Vascular attenuation Mild Vascular attenuation   Periphery Attached, mild peripheral cystoid degeneration, No RT/RD on 360 scleral depression  Attached, No RT/RD        Refraction    Manifest Refraction      Sphere Cylinder Axis Dist VA   Right       Left +1.50 +0.50 005 20/20          IMAGING AND PROCEDURES  Imaging and Procedures for @TODAY @  OCT, Retina - OU - Both Eyes       Right Eye Quality was good. Central Foveal Thickness: 294. Progression has been stable. Findings include normal foveal contour, no IRF, no SRF.   Left Eye Quality was good. Central Foveal Thickness: 303. Progression has been stable. Findings include normal foveal contour, no IRF, no SRF (Partial PVD).   Notes *Images captured and stored on drive  Diagnosis / Impression:  NFP; no IRF/SRF OU  Clinical management:  See below  Abbreviations: NFP - Normal foveal profile. CME - cystoid macular edema. PED - pigment epithelial detachment. IRF - intraretinal fluid. SRF - subretinal fluid. EZ - ellipsoid zone. ERM - epiretinal membrane. ORA - outer retinal atrophy. ORT - outer retinal tubulation. SRHM - subretinal hyper-reflective material. IRHM - intraretinal hyper-reflective material                 ASSESSMENT/PLAN:    ICD-10-CM   1. Posterior vitreous detachment of right eye  H43.811   2. Retinal edema  H35.81 OCT, Retina - OU - Both Eyes  3.  Essential hypertension  I10   4. Hypertensive retinopathy of both eyes  H35.033   5. Combined forms of age-related cataract of both eyes  H25.813     1,2. PVD / vitreous syneresis OD  - onset of symptomatic flashes/floaters Apr 27 2020 -- symptoms subjectively improving  - Discussed findings and prognosis  - No RT or RD on 360 scleral depressed exam  - Reviewed s/s of RT/RD  - Strict return precautions for any such RT/RD signs/symptoms  - pt is cleared from a retina standpoint for release to Dr. Kathlen Mody and resumption of primary eye care  3,4. Hypertensive retinopathy OU - discussed importance of tight BP control - monitor  5. Mixed cataracts OU  - The symptoms of cataract, surgical options, and treatments and risks were discussed with patient.  - discussed diagnosis and progression  - under the expert care of Dr. Kathlen Mody  Ophthalmic Meds Ordered this visit:  No orders of the defined types were placed in this encounter.      Return if symptoms worsen or fail to improve.  There are no Patient Instructions on file for this visit.   Explained the diagnoses, plan, and follow up with the patient and they expressed understanding.  Patient expressed understanding of the importance of proper follow up care.   This document serves as a record of services personally performed by Gardiner Sleeper, MD, PhD. It was created on their behalf by Estill Bakes, COT an ophthalmic technician. The creation of this record is the provider's dictation and/or activities during the visit.    Electronically signed by: Estill Bakes, COT 07/20/20 @  11:24 PM  Gardiner Sleeper, M.D., Ph.D. Diseases & Surgery of the Retina and [place] 07/20/2020   I have reviewed the above documentation for accuracy and completeness, and I agree with the above. Gardiner Sleeper, M.D., Ph.D. 07/20/20 11:24 PM   Abbreviations: M myopia (nearsighted); A astigmatism; H hyperopia  (farsighted); P presbyopia; Mrx spectacle prescription;  CTL contact lenses; OD right eye; OS left eye; OU both eyes  XT exotropia; ET esotropia; PEK punctate epithelial keratitis; PEE punctate epithelial erosions; DES dry eye syndrome; MGD meibomian gland dysfunction; ATs artificial tears; PFAT's preservative free artificial tears; [place] nuclear sclerotic cataract; PSC posterior subcapsular cataract; ERM epi-retinal membrane; PVD posterior vitreous detachment; RD retinal detachment; DM diabetes mellitus; DR diabetic retinopathy; NPDR non-proliferative diabetic retinopathy; PDR proliferative diabetic retinopathy; CSME clinically significant macular edema; DME diabetic macular edema; dbh dot blot hemorrhages; CWS cotton wool spot; POAG primary open angle glaucoma; C/D cup-to-disc ratio; HVF humphrey visual field; GVF goldmann visual field; OCT optical coherence tomography; IOP intraocular pressure; BRVO Branch retinal vein occlusion; CRVO central retinal vein occlusion; CRAO central retinal artery occlusion; BRAO branch retinal artery occlusion; RT retinal tear; SB scleral buckle; PPV pars plana vitrectomy; VH Vitreous hemorrhage; PRP panretinal laser photocoagulation; IVK intravitreal kenalog; VMT vitreomacular traction; MH Macular hole;  NVD neovascularization of the disc; NVE neovascularization elsewhere; AREDS age related eye disease study; ARMD age related macular degeneration; POAG primary open angle glaucoma; EBMD epithelial/anterior basement membrane dystrophy; ACIOL anterior chamber intraocular lens; IOL intraocular lens; PCIOL posterior chamber intraocular lens; Phaco/IOL phacoemulsification with intraocular lens placement; [place] photorefractive keratectomy; LASIK laser assisted in situ keratomileusis; HTN hypertension; DM diabetes mellitus; COPD chronic obstructive pulmonary disease

## 2020-07-22 ENCOUNTER — Encounter (INDEPENDENT_AMBULATORY_CARE_PROVIDER_SITE_OTHER): Payer: BC Managed Care – PPO | Admitting: Ophthalmology

## 2020-09-21 ENCOUNTER — Encounter (HOSPITAL_COMMUNITY): Payer: Self-pay | Admitting: Physical Therapy

## 2020-09-21 NOTE — Therapy (Signed)
[place], [place], [address] Phone: [phone]   Fax:  [phone]  Patient Details  Name: [name] MRN: [id number] Date of Birth: 03-15-1956 Referring Provider:  No ref. provider found  Encounter Date: 09/21/2020 PHYSICAL THERAPY DISCHARGE SUMMARY  Visits from Start of Care: 6  Current functional level related to goals / functional outcomes: unknown   Remaining deficits: unknown Education / Equipment: HEP Plan: Patient agrees to discharge.  Patient goals were not met. Patient is being discharged due to not returning since the last visit.  ?????    Rayetta Humphrey, PT CLT [phone] 09/21/2020, 4:07 PM  [facility] [address] [place], [place], [address] Phone: [phone]   Fax:  [phone]

## 2022-02-02 DIAGNOSIS — K219 Gastro-esophageal reflux disease without esophagitis: Secondary | ICD-10-CM | POA: Diagnosis not present

## 2022-02-02 DIAGNOSIS — Z86010 Personal history of colonic polyps: Secondary | ICD-10-CM | POA: Diagnosis not present

## 2022-02-28 DIAGNOSIS — R1084 Generalized abdominal pain: Secondary | ICD-10-CM | POA: Diagnosis not present

## 2022-02-28 DIAGNOSIS — F419 Anxiety disorder, unspecified: Secondary | ICD-10-CM | POA: Diagnosis not present

## 2022-02-28 DIAGNOSIS — K219 Gastro-esophageal reflux disease without esophagitis: Secondary | ICD-10-CM | POA: Diagnosis not present

## 2022-03-29 DIAGNOSIS — H04123 Dry eye syndrome of bilateral lacrimal glands: Secondary | ICD-10-CM | POA: Diagnosis not present

## 2022-03-29 DIAGNOSIS — H5319 Other subjective visual disturbances: Secondary | ICD-10-CM | POA: Diagnosis not present

## 2022-03-29 DIAGNOSIS — H40013 Open angle with borderline findings, low risk, bilateral: Secondary | ICD-10-CM | POA: Diagnosis not present

## 2022-03-29 DIAGNOSIS — H25813 Combined forms of age-related cataract, bilateral: Secondary | ICD-10-CM | POA: Diagnosis not present

## 2022-06-16 DIAGNOSIS — Z79899 Other long term (current) drug therapy: Secondary | ICD-10-CM | POA: Diagnosis not present

## 2022-06-16 DIAGNOSIS — M810 Age-related osteoporosis without current pathological fracture: Secondary | ICD-10-CM | POA: Diagnosis not present

## 2022-06-16 DIAGNOSIS — I10 Essential (primary) hypertension: Secondary | ICD-10-CM | POA: Diagnosis not present

## 2022-06-16 DIAGNOSIS — E039 Hypothyroidism, unspecified: Secondary | ICD-10-CM | POA: Diagnosis not present

## 2022-07-06 DIAGNOSIS — M810 Age-related osteoporosis without current pathological fracture: Secondary | ICD-10-CM | POA: Diagnosis not present

## 2022-07-06 DIAGNOSIS — Z23 Encounter for immunization: Secondary | ICD-10-CM | POA: Diagnosis not present

## 2022-07-06 DIAGNOSIS — I10 Essential (primary) hypertension: Secondary | ICD-10-CM | POA: Diagnosis not present

## 2022-08-18 DIAGNOSIS — N952 Postmenopausal atrophic vaginitis: Secondary | ICD-10-CM | POA: Diagnosis not present

## 2022-08-18 DIAGNOSIS — Z01419 Encounter for gynecological examination (general) (routine) without abnormal findings: Secondary | ICD-10-CM | POA: Diagnosis not present

## 2022-08-18 DIAGNOSIS — Z1231 Encounter for screening mammogram for malignant neoplasm of breast: Secondary | ICD-10-CM | POA: Diagnosis not present

## 2022-08-18 DIAGNOSIS — Z681 Body mass index (BMI) 19 or less, adult: Secondary | ICD-10-CM | POA: Diagnosis not present

## 2022-08-18 DIAGNOSIS — M810 Age-related osteoporosis without current pathological fracture: Secondary | ICD-10-CM | POA: Diagnosis not present

## 2022-09-01 DIAGNOSIS — M810 Age-related osteoporosis without current pathological fracture: Secondary | ICD-10-CM | POA: Diagnosis not present

## 2022-09-26 DIAGNOSIS — N952 Postmenopausal atrophic vaginitis: Secondary | ICD-10-CM | POA: Diagnosis not present

## 2022-09-26 DIAGNOSIS — N760 Acute vaginitis: Secondary | ICD-10-CM | POA: Diagnosis not present

## 2022-10-31 DIAGNOSIS — L821 Other seborrheic keratosis: Secondary | ICD-10-CM | POA: Diagnosis not present

## 2022-10-31 DIAGNOSIS — D2272 Melanocytic nevi of left lower limb, including hip: Secondary | ICD-10-CM | POA: Diagnosis not present

## 2022-10-31 DIAGNOSIS — L819 Disorder of pigmentation, unspecified: Secondary | ICD-10-CM | POA: Diagnosis not present

## 2022-10-31 DIAGNOSIS — L723 Sebaceous cyst: Secondary | ICD-10-CM | POA: Diagnosis not present

## 2022-10-31 DIAGNOSIS — D225 Melanocytic nevi of trunk: Secondary | ICD-10-CM | POA: Diagnosis not present

## 2022-10-31 DIAGNOSIS — L814 Other melanin hyperpigmentation: Secondary | ICD-10-CM | POA: Diagnosis not present

## 2022-12-24 DIAGNOSIS — J069 Acute upper respiratory infection, unspecified: Secondary | ICD-10-CM | POA: Diagnosis not present

## 2022-12-24 DIAGNOSIS — Z681 Body mass index (BMI) 19 or less, adult: Secondary | ICD-10-CM | POA: Diagnosis not present

## 2023-01-05 DIAGNOSIS — R051 Acute cough: Secondary | ICD-10-CM | POA: Diagnosis not present

## 2023-01-05 DIAGNOSIS — I10 Essential (primary) hypertension: Secondary | ICD-10-CM | POA: Diagnosis not present

## 2023-01-05 DIAGNOSIS — Z23 Encounter for immunization: Secondary | ICD-10-CM | POA: Diagnosis not present

## 2023-02-22 DIAGNOSIS — M810 Age-related osteoporosis without current pathological fracture: Secondary | ICD-10-CM | POA: Diagnosis not present

## 2023-03-22 DIAGNOSIS — M810 Age-related osteoporosis without current pathological fracture: Secondary | ICD-10-CM | POA: Diagnosis not present

## 2023-04-04 DIAGNOSIS — H04123 Dry eye syndrome of bilateral lacrimal glands: Secondary | ICD-10-CM | POA: Diagnosis not present

## 2023-04-04 DIAGNOSIS — H25813 Combined forms of age-related cataract, bilateral: Secondary | ICD-10-CM | POA: Diagnosis not present

## 2023-04-04 DIAGNOSIS — H401231 Low-tension glaucoma, bilateral, mild stage: Secondary | ICD-10-CM | POA: Diagnosis not present

## 2023-04-04 DIAGNOSIS — H35033 Hypertensive retinopathy, bilateral: Secondary | ICD-10-CM | POA: Diagnosis not present

## 2023-05-04 DIAGNOSIS — H401231 Low-tension glaucoma, bilateral, mild stage: Secondary | ICD-10-CM | POA: Diagnosis not present

## 2023-06-01 DIAGNOSIS — H5319 Other subjective visual disturbances: Secondary | ICD-10-CM | POA: Diagnosis not present

## 2023-06-01 DIAGNOSIS — H401231 Low-tension glaucoma, bilateral, mild stage: Secondary | ICD-10-CM | POA: Diagnosis not present

## 2023-06-01 DIAGNOSIS — H43813 Vitreous degeneration, bilateral: Secondary | ICD-10-CM | POA: Diagnosis not present

## 2023-06-05 DIAGNOSIS — H401221 Low-tension glaucoma, left eye, mild stage: Secondary | ICD-10-CM | POA: Diagnosis not present

## 2023-06-12 DIAGNOSIS — N644 Mastodynia: Secondary | ICD-10-CM | POA: Diagnosis not present

## 2023-06-14 ENCOUNTER — Other Ambulatory Visit: Payer: Self-pay | Admitting: Obstetrics and Gynecology

## 2023-06-14 DIAGNOSIS — N644 Mastodynia: Secondary | ICD-10-CM

## 2023-06-16 DIAGNOSIS — H401211 Low-tension glaucoma, right eye, mild stage: Secondary | ICD-10-CM | POA: Diagnosis not present

## 2023-06-22 DIAGNOSIS — M5416 Radiculopathy, lumbar region: Secondary | ICD-10-CM | POA: Diagnosis not present

## 2023-06-26 ENCOUNTER — Ambulatory Visit: Payer: BC Managed Care – PPO

## 2023-06-26 ENCOUNTER — Ambulatory Visit
Admission: RE | Admit: 2023-06-26 | Discharge: 2023-06-26 | Disposition: A | Discharge status: Home / Self Care | Payer: Medicare PPO | Source: Ambulatory Visit | Attending: Obstetrics and Gynecology | Admitting: Obstetrics and Gynecology

## 2023-06-26 DIAGNOSIS — H04123 Dry eye syndrome of bilateral lacrimal glands: Secondary | ICD-10-CM | POA: Diagnosis not present

## 2023-06-26 DIAGNOSIS — N644 Mastodynia: Secondary | ICD-10-CM

## 2023-07-06 DIAGNOSIS — M4807 Spinal stenosis, lumbosacral region: Secondary | ICD-10-CM | POA: Diagnosis not present

## 2023-07-06 DIAGNOSIS — M5416 Radiculopathy, lumbar region: Secondary | ICD-10-CM | POA: Diagnosis not present

## 2023-07-06 DIAGNOSIS — R29890 Loss of height: Secondary | ICD-10-CM | POA: Diagnosis not present

## 2023-07-06 DIAGNOSIS — M5136 Other intervertebral disc degeneration, lumbar region: Secondary | ICD-10-CM | POA: Diagnosis not present

## 2023-07-06 DIAGNOSIS — M48061 Spinal stenosis, lumbar region without neurogenic claudication: Secondary | ICD-10-CM | POA: Diagnosis not present

## 2023-07-13 DIAGNOSIS — M5416 Radiculopathy, lumbar region: Secondary | ICD-10-CM | POA: Diagnosis not present

## 2023-07-18 DIAGNOSIS — H401231 Low-tension glaucoma, bilateral, mild stage: Secondary | ICD-10-CM | POA: Diagnosis not present

## 2023-08-10 DIAGNOSIS — Z79899 Other long term (current) drug therapy: Secondary | ICD-10-CM | POA: Diagnosis not present

## 2023-08-10 DIAGNOSIS — M810 Age-related osteoporosis without current pathological fracture: Secondary | ICD-10-CM | POA: Diagnosis not present

## 2023-08-10 DIAGNOSIS — H40003 Preglaucoma, unspecified, bilateral: Secondary | ICD-10-CM | POA: Diagnosis not present

## 2023-08-10 DIAGNOSIS — I10 Essential (primary) hypertension: Secondary | ICD-10-CM | POA: Diagnosis not present

## 2023-08-23 DIAGNOSIS — Z1211 Encounter for screening for malignant neoplasm of colon: Secondary | ICD-10-CM | POA: Diagnosis not present

## 2023-08-23 DIAGNOSIS — M48061 Spinal stenosis, lumbar region without neurogenic claudication: Secondary | ICD-10-CM | POA: Diagnosis not present

## 2023-08-23 DIAGNOSIS — K219 Gastro-esophageal reflux disease without esophagitis: Secondary | ICD-10-CM | POA: Diagnosis not present

## 2023-08-23 DIAGNOSIS — M542 Cervicalgia: Secondary | ICD-10-CM | POA: Diagnosis not present

## 2023-08-29 DIAGNOSIS — Z0001 Encounter for general adult medical examination with abnormal findings: Secondary | ICD-10-CM | POA: Diagnosis not present

## 2023-08-31 DIAGNOSIS — G4700 Insomnia, unspecified: Secondary | ICD-10-CM | POA: Diagnosis not present

## 2023-08-31 DIAGNOSIS — M810 Age-related osteoporosis without current pathological fracture: Secondary | ICD-10-CM | POA: Diagnosis not present

## 2023-08-31 DIAGNOSIS — Z01419 Encounter for gynecological examination (general) (routine) without abnormal findings: Secondary | ICD-10-CM | POA: Diagnosis not present

## 2023-08-31 DIAGNOSIS — N952 Postmenopausal atrophic vaginitis: Secondary | ICD-10-CM | POA: Diagnosis not present

## 2023-08-31 DIAGNOSIS — Z6820 Body mass index (BMI) 20.0-20.9, adult: Secondary | ICD-10-CM | POA: Diagnosis not present

## 2023-09-18 DIAGNOSIS — M5416 Radiculopathy, lumbar region: Secondary | ICD-10-CM | POA: Diagnosis not present

## 2023-09-21 DIAGNOSIS — H40003 Preglaucoma, unspecified, bilateral: Secondary | ICD-10-CM | POA: Diagnosis not present

## 2023-09-25 DIAGNOSIS — N958 Other specified menopausal and perimenopausal disorders: Secondary | ICD-10-CM | POA: Diagnosis not present

## 2023-09-25 DIAGNOSIS — Z1231 Encounter for screening mammogram for malignant neoplasm of breast: Secondary | ICD-10-CM | POA: Diagnosis not present

## 2023-09-25 DIAGNOSIS — M816 Localized osteoporosis [Lequesne]: Secondary | ICD-10-CM | POA: Diagnosis not present

## 2023-10-04 DIAGNOSIS — M4722 Other spondylosis with radiculopathy, cervical region: Secondary | ICD-10-CM | POA: Diagnosis not present

## 2023-10-04 DIAGNOSIS — M5412 Radiculopathy, cervical region: Secondary | ICD-10-CM | POA: Diagnosis not present

## 2023-10-04 DIAGNOSIS — S199XXD Unspecified injury of neck, subsequent encounter: Secondary | ICD-10-CM | POA: Diagnosis not present

## 2023-10-04 DIAGNOSIS — M50121 Cervical disc disorder at C4-C5 level with radiculopathy: Secondary | ICD-10-CM | POA: Diagnosis not present

## 2023-10-04 DIAGNOSIS — M4802 Spinal stenosis, cervical region: Secondary | ICD-10-CM | POA: Diagnosis not present

## 2023-10-05 DIAGNOSIS — Z0389 Encounter for observation for other suspected diseases and conditions ruled out: Secondary | ICD-10-CM | POA: Diagnosis not present

## 2023-10-05 DIAGNOSIS — N83209 Unspecified ovarian cyst, unspecified side: Secondary | ICD-10-CM | POA: Diagnosis not present

## 2023-10-06 ENCOUNTER — Other Ambulatory Visit: Payer: Self-pay | Admitting: Obstetrics and Gynecology

## 2023-10-06 DIAGNOSIS — M62552 Muscle wasting and atrophy, not elsewhere classified, left thigh: Secondary | ICD-10-CM | POA: Diagnosis not present

## 2023-10-06 DIAGNOSIS — R92333 Mammographic heterogeneous density, bilateral breasts: Secondary | ICD-10-CM

## 2023-10-06 DIAGNOSIS — M2569 Stiffness of other specified joint, not elsewhere classified: Secondary | ICD-10-CM | POA: Diagnosis not present

## 2023-10-06 DIAGNOSIS — M5416 Radiculopathy, lumbar region: Secondary | ICD-10-CM | POA: Diagnosis not present

## 2023-10-06 DIAGNOSIS — M62551 Muscle wasting and atrophy, not elsewhere classified, right thigh: Secondary | ICD-10-CM | POA: Diagnosis not present

## 2023-10-13 DIAGNOSIS — M810 Age-related osteoporosis without current pathological fracture: Secondary | ICD-10-CM | POA: Diagnosis not present

## 2023-10-16 DIAGNOSIS — M62551 Muscle wasting and atrophy, not elsewhere classified, right thigh: Secondary | ICD-10-CM | POA: Diagnosis not present

## 2023-10-16 DIAGNOSIS — M62552 Muscle wasting and atrophy, not elsewhere classified, left thigh: Secondary | ICD-10-CM | POA: Diagnosis not present

## 2023-10-16 DIAGNOSIS — M2569 Stiffness of other specified joint, not elsewhere classified: Secondary | ICD-10-CM | POA: Diagnosis not present

## 2023-10-16 DIAGNOSIS — M5416 Radiculopathy, lumbar region: Secondary | ICD-10-CM | POA: Diagnosis not present

## 2023-10-17 DIAGNOSIS — M5416 Radiculopathy, lumbar region: Secondary | ICD-10-CM | POA: Diagnosis not present

## 2023-10-17 DIAGNOSIS — M5412 Radiculopathy, cervical region: Secondary | ICD-10-CM | POA: Diagnosis not present

## 2023-10-18 DIAGNOSIS — M62552 Muscle wasting and atrophy, not elsewhere classified, left thigh: Secondary | ICD-10-CM | POA: Diagnosis not present

## 2023-10-18 DIAGNOSIS — M2569 Stiffness of other specified joint, not elsewhere classified: Secondary | ICD-10-CM | POA: Diagnosis not present

## 2023-10-18 DIAGNOSIS — M5416 Radiculopathy, lumbar region: Secondary | ICD-10-CM | POA: Diagnosis not present

## 2023-10-18 DIAGNOSIS — M62551 Muscle wasting and atrophy, not elsewhere classified, right thigh: Secondary | ICD-10-CM | POA: Diagnosis not present

## 2023-10-24 DIAGNOSIS — M62551 Muscle wasting and atrophy, not elsewhere classified, right thigh: Secondary | ICD-10-CM | POA: Diagnosis not present

## 2023-10-24 DIAGNOSIS — M2569 Stiffness of other specified joint, not elsewhere classified: Secondary | ICD-10-CM | POA: Diagnosis not present

## 2023-10-24 DIAGNOSIS — M5416 Radiculopathy, lumbar region: Secondary | ICD-10-CM | POA: Diagnosis not present

## 2023-10-24 DIAGNOSIS — M62552 Muscle wasting and atrophy, not elsewhere classified, left thigh: Secondary | ICD-10-CM | POA: Diagnosis not present

## 2023-10-25 DIAGNOSIS — M810 Age-related osteoporosis without current pathological fracture: Secondary | ICD-10-CM | POA: Diagnosis not present

## 2023-10-26 DIAGNOSIS — M2569 Stiffness of other specified joint, not elsewhere classified: Secondary | ICD-10-CM | POA: Diagnosis not present

## 2023-10-26 DIAGNOSIS — M5416 Radiculopathy, lumbar region: Secondary | ICD-10-CM | POA: Diagnosis not present

## 2023-10-26 DIAGNOSIS — M62551 Muscle wasting and atrophy, not elsewhere classified, right thigh: Secondary | ICD-10-CM | POA: Diagnosis not present

## 2023-10-26 DIAGNOSIS — M62552 Muscle wasting and atrophy, not elsewhere classified, left thigh: Secondary | ICD-10-CM | POA: Diagnosis not present

## 2023-10-31 DIAGNOSIS — M62552 Muscle wasting and atrophy, not elsewhere classified, left thigh: Secondary | ICD-10-CM | POA: Diagnosis not present

## 2023-10-31 DIAGNOSIS — M2569 Stiffness of other specified joint, not elsewhere classified: Secondary | ICD-10-CM | POA: Diagnosis not present

## 2023-10-31 DIAGNOSIS — M5416 Radiculopathy, lumbar region: Secondary | ICD-10-CM | POA: Diagnosis not present

## 2023-10-31 DIAGNOSIS — M62551 Muscle wasting and atrophy, not elsewhere classified, right thigh: Secondary | ICD-10-CM | POA: Diagnosis not present

## 2023-11-02 DIAGNOSIS — M5416 Radiculopathy, lumbar region: Secondary | ICD-10-CM | POA: Diagnosis not present

## 2023-11-02 DIAGNOSIS — M62551 Muscle wasting and atrophy, not elsewhere classified, right thigh: Secondary | ICD-10-CM | POA: Diagnosis not present

## 2023-11-02 DIAGNOSIS — M62552 Muscle wasting and atrophy, not elsewhere classified, left thigh: Secondary | ICD-10-CM | POA: Diagnosis not present

## 2023-11-02 DIAGNOSIS — M2569 Stiffness of other specified joint, not elsewhere classified: Secondary | ICD-10-CM | POA: Diagnosis not present

## 2023-11-06 DIAGNOSIS — M62551 Muscle wasting and atrophy, not elsewhere classified, right thigh: Secondary | ICD-10-CM | POA: Diagnosis not present

## 2023-11-06 DIAGNOSIS — M2569 Stiffness of other specified joint, not elsewhere classified: Secondary | ICD-10-CM | POA: Diagnosis not present

## 2023-11-06 DIAGNOSIS — M5416 Radiculopathy, lumbar region: Secondary | ICD-10-CM | POA: Diagnosis not present

## 2023-11-06 DIAGNOSIS — M62552 Muscle wasting and atrophy, not elsewhere classified, left thigh: Secondary | ICD-10-CM | POA: Diagnosis not present

## 2023-11-08 DIAGNOSIS — M62552 Muscle wasting and atrophy, not elsewhere classified, left thigh: Secondary | ICD-10-CM | POA: Diagnosis not present

## 2023-11-08 DIAGNOSIS — M2569 Stiffness of other specified joint, not elsewhere classified: Secondary | ICD-10-CM | POA: Diagnosis not present

## 2023-11-08 DIAGNOSIS — M5416 Radiculopathy, lumbar region: Secondary | ICD-10-CM | POA: Diagnosis not present

## 2023-11-08 DIAGNOSIS — M62551 Muscle wasting and atrophy, not elsewhere classified, right thigh: Secondary | ICD-10-CM | POA: Diagnosis not present

## 2023-11-09 ENCOUNTER — Encounter: Payer: Self-pay | Admitting: Obstetrics and Gynecology

## 2023-11-10 DIAGNOSIS — M5416 Radiculopathy, lumbar region: Secondary | ICD-10-CM | POA: Diagnosis not present

## 2023-11-10 DIAGNOSIS — M2569 Stiffness of other specified joint, not elsewhere classified: Secondary | ICD-10-CM | POA: Diagnosis not present

## 2023-11-10 DIAGNOSIS — M62551 Muscle wasting and atrophy, not elsewhere classified, right thigh: Secondary | ICD-10-CM | POA: Diagnosis not present

## 2023-11-10 DIAGNOSIS — M62552 Muscle wasting and atrophy, not elsewhere classified, left thigh: Secondary | ICD-10-CM | POA: Diagnosis not present

## 2023-11-13 DIAGNOSIS — M5416 Radiculopathy, lumbar region: Secondary | ICD-10-CM | POA: Diagnosis not present

## 2023-11-13 DIAGNOSIS — M2569 Stiffness of other specified joint, not elsewhere classified: Secondary | ICD-10-CM | POA: Diagnosis not present

## 2023-11-13 DIAGNOSIS — M62551 Muscle wasting and atrophy, not elsewhere classified, right thigh: Secondary | ICD-10-CM | POA: Diagnosis not present

## 2023-11-13 DIAGNOSIS — M62552 Muscle wasting and atrophy, not elsewhere classified, left thigh: Secondary | ICD-10-CM | POA: Diagnosis not present

## 2023-11-15 DIAGNOSIS — M2569 Stiffness of other specified joint, not elsewhere classified: Secondary | ICD-10-CM | POA: Diagnosis not present

## 2023-11-15 DIAGNOSIS — M5416 Radiculopathy, lumbar region: Secondary | ICD-10-CM | POA: Diagnosis not present

## 2023-11-15 DIAGNOSIS — M62551 Muscle wasting and atrophy, not elsewhere classified, right thigh: Secondary | ICD-10-CM | POA: Diagnosis not present

## 2023-11-15 DIAGNOSIS — M62552 Muscle wasting and atrophy, not elsewhere classified, left thigh: Secondary | ICD-10-CM | POA: Diagnosis not present

## 2023-11-16 ENCOUNTER — Other Ambulatory Visit: Payer: Medicare PPO

## 2023-11-16 DIAGNOSIS — M48061 Spinal stenosis, lumbar region without neurogenic claudication: Secondary | ICD-10-CM | POA: Diagnosis not present

## 2023-11-20 DIAGNOSIS — M5416 Radiculopathy, lumbar region: Secondary | ICD-10-CM | POA: Diagnosis not present

## 2023-11-20 DIAGNOSIS — M2569 Stiffness of other specified joint, not elsewhere classified: Secondary | ICD-10-CM | POA: Diagnosis not present

## 2023-11-20 DIAGNOSIS — M62552 Muscle wasting and atrophy, not elsewhere classified, left thigh: Secondary | ICD-10-CM | POA: Diagnosis not present

## 2023-11-20 DIAGNOSIS — M62551 Muscle wasting and atrophy, not elsewhere classified, right thigh: Secondary | ICD-10-CM | POA: Diagnosis not present

## 2023-11-28 DIAGNOSIS — M62552 Muscle wasting and atrophy, not elsewhere classified, left thigh: Secondary | ICD-10-CM | POA: Diagnosis not present

## 2023-11-28 DIAGNOSIS — M5416 Radiculopathy, lumbar region: Secondary | ICD-10-CM | POA: Diagnosis not present

## 2023-11-28 DIAGNOSIS — M62551 Muscle wasting and atrophy, not elsewhere classified, right thigh: Secondary | ICD-10-CM | POA: Diagnosis not present

## 2023-11-28 DIAGNOSIS — M2569 Stiffness of other specified joint, not elsewhere classified: Secondary | ICD-10-CM | POA: Diagnosis not present

## 2023-11-30 DIAGNOSIS — M62552 Muscle wasting and atrophy, not elsewhere classified, left thigh: Secondary | ICD-10-CM | POA: Diagnosis not present

## 2023-11-30 DIAGNOSIS — M2569 Stiffness of other specified joint, not elsewhere classified: Secondary | ICD-10-CM | POA: Diagnosis not present

## 2023-11-30 DIAGNOSIS — M62551 Muscle wasting and atrophy, not elsewhere classified, right thigh: Secondary | ICD-10-CM | POA: Diagnosis not present

## 2023-11-30 DIAGNOSIS — M5416 Radiculopathy, lumbar region: Secondary | ICD-10-CM | POA: Diagnosis not present

## 2023-12-04 DIAGNOSIS — M62551 Muscle wasting and atrophy, not elsewhere classified, right thigh: Secondary | ICD-10-CM | POA: Diagnosis not present

## 2023-12-04 DIAGNOSIS — M62552 Muscle wasting and atrophy, not elsewhere classified, left thigh: Secondary | ICD-10-CM | POA: Diagnosis not present

## 2023-12-04 DIAGNOSIS — M5416 Radiculopathy, lumbar region: Secondary | ICD-10-CM | POA: Diagnosis not present

## 2023-12-04 DIAGNOSIS — M2569 Stiffness of other specified joint, not elsewhere classified: Secondary | ICD-10-CM | POA: Diagnosis not present

## 2023-12-07 DIAGNOSIS — M5416 Radiculopathy, lumbar region: Secondary | ICD-10-CM | POA: Diagnosis not present

## 2023-12-07 DIAGNOSIS — M62551 Muscle wasting and atrophy, not elsewhere classified, right thigh: Secondary | ICD-10-CM | POA: Diagnosis not present

## 2023-12-07 DIAGNOSIS — M62552 Muscle wasting and atrophy, not elsewhere classified, left thigh: Secondary | ICD-10-CM | POA: Diagnosis not present

## 2023-12-07 DIAGNOSIS — M2569 Stiffness of other specified joint, not elsewhere classified: Secondary | ICD-10-CM | POA: Diagnosis not present

## 2024-01-01 ENCOUNTER — Ambulatory Visit
Admission: RE | Admit: 2024-01-01 | Discharge: 2024-01-01 | Disposition: A | Discharge status: Home / Self Care | Payer: Medicare PPO | Source: Ambulatory Visit | Attending: Obstetrics and Gynecology | Admitting: Obstetrics and Gynecology

## 2024-01-01 DIAGNOSIS — R92333 Mammographic heterogeneous density, bilateral breasts: Secondary | ICD-10-CM

## 2024-01-01 DIAGNOSIS — Z1239 Encounter for other screening for malignant neoplasm of breast: Secondary | ICD-10-CM | POA: Diagnosis not present

## 2024-01-01 MED ORDER — GADOPICLENOL 0.5 MMOL/ML IV SOLN
5.0000 mL | Freq: Once | INTRAVENOUS | Status: AC | PRN
  Administered 2024-01-01: 5 mL via INTRAVENOUS

## 2024-01-08 DIAGNOSIS — K219 Gastro-esophageal reflux disease without esophagitis: Secondary | ICD-10-CM | POA: Diagnosis not present

## 2024-01-08 DIAGNOSIS — Z1211 Encounter for screening for malignant neoplasm of colon: Secondary | ICD-10-CM | POA: Diagnosis not present

## 2024-01-15 DIAGNOSIS — M9903 Segmental and somatic dysfunction of lumbar region: Secondary | ICD-10-CM | POA: Diagnosis not present

## 2024-01-15 DIAGNOSIS — M9905 Segmental and somatic dysfunction of pelvic region: Secondary | ICD-10-CM | POA: Diagnosis not present

## 2024-01-15 DIAGNOSIS — M25552 Pain in left hip: Secondary | ICD-10-CM | POA: Diagnosis not present

## 2024-01-15 DIAGNOSIS — M5116 Intervertebral disc disorders with radiculopathy, lumbar region: Secondary | ICD-10-CM | POA: Diagnosis not present

## 2024-01-18 DIAGNOSIS — M25552 Pain in left hip: Secondary | ICD-10-CM | POA: Diagnosis not present

## 2024-01-18 DIAGNOSIS — M5116 Intervertebral disc disorders with radiculopathy, lumbar region: Secondary | ICD-10-CM | POA: Diagnosis not present

## 2024-01-18 DIAGNOSIS — M9905 Segmental and somatic dysfunction of pelvic region: Secondary | ICD-10-CM | POA: Diagnosis not present

## 2024-01-18 DIAGNOSIS — M9903 Segmental and somatic dysfunction of lumbar region: Secondary | ICD-10-CM | POA: Diagnosis not present

## 2024-01-18 DIAGNOSIS — H40003 Preglaucoma, unspecified, bilateral: Secondary | ICD-10-CM | POA: Diagnosis not present

## 2024-01-22 DIAGNOSIS — M25552 Pain in left hip: Secondary | ICD-10-CM | POA: Diagnosis not present

## 2024-01-22 DIAGNOSIS — M5116 Intervertebral disc disorders with radiculopathy, lumbar region: Secondary | ICD-10-CM | POA: Diagnosis not present

## 2024-01-22 DIAGNOSIS — M9903 Segmental and somatic dysfunction of lumbar region: Secondary | ICD-10-CM | POA: Diagnosis not present

## 2024-01-22 DIAGNOSIS — M9905 Segmental and somatic dysfunction of pelvic region: Secondary | ICD-10-CM | POA: Diagnosis not present

## 2024-01-24 DIAGNOSIS — M9903 Segmental and somatic dysfunction of lumbar region: Secondary | ICD-10-CM | POA: Diagnosis not present

## 2024-01-24 DIAGNOSIS — M5116 Intervertebral disc disorders with radiculopathy, lumbar region: Secondary | ICD-10-CM | POA: Diagnosis not present

## 2024-01-24 DIAGNOSIS — M25552 Pain in left hip: Secondary | ICD-10-CM | POA: Diagnosis not present

## 2024-01-24 DIAGNOSIS — M9905 Segmental and somatic dysfunction of pelvic region: Secondary | ICD-10-CM | POA: Diagnosis not present

## 2024-01-29 DIAGNOSIS — M25552 Pain in left hip: Secondary | ICD-10-CM | POA: Diagnosis not present

## 2024-01-29 DIAGNOSIS — M5116 Intervertebral disc disorders with radiculopathy, lumbar region: Secondary | ICD-10-CM | POA: Diagnosis not present

## 2024-01-29 DIAGNOSIS — M9903 Segmental and somatic dysfunction of lumbar region: Secondary | ICD-10-CM | POA: Diagnosis not present

## 2024-01-29 DIAGNOSIS — M9905 Segmental and somatic dysfunction of pelvic region: Secondary | ICD-10-CM | POA: Diagnosis not present

## 2024-02-01 DIAGNOSIS — M25552 Pain in left hip: Secondary | ICD-10-CM | POA: Diagnosis not present

## 2024-02-01 DIAGNOSIS — M5116 Intervertebral disc disorders with radiculopathy, lumbar region: Secondary | ICD-10-CM | POA: Diagnosis not present

## 2024-02-01 DIAGNOSIS — M9903 Segmental and somatic dysfunction of lumbar region: Secondary | ICD-10-CM | POA: Diagnosis not present

## 2024-02-01 DIAGNOSIS — M9905 Segmental and somatic dysfunction of pelvic region: Secondary | ICD-10-CM | POA: Diagnosis not present

## 2024-02-06 DIAGNOSIS — M25552 Pain in left hip: Secondary | ICD-10-CM | POA: Diagnosis not present

## 2024-02-06 DIAGNOSIS — M5116 Intervertebral disc disorders with radiculopathy, lumbar region: Secondary | ICD-10-CM | POA: Diagnosis not present

## 2024-02-06 DIAGNOSIS — M9903 Segmental and somatic dysfunction of lumbar region: Secondary | ICD-10-CM | POA: Diagnosis not present

## 2024-02-06 DIAGNOSIS — M9905 Segmental and somatic dysfunction of pelvic region: Secondary | ICD-10-CM | POA: Diagnosis not present

## 2024-02-08 DIAGNOSIS — M9903 Segmental and somatic dysfunction of lumbar region: Secondary | ICD-10-CM | POA: Diagnosis not present

## 2024-02-08 DIAGNOSIS — M5116 Intervertebral disc disorders with radiculopathy, lumbar region: Secondary | ICD-10-CM | POA: Diagnosis not present

## 2024-02-08 DIAGNOSIS — M25552 Pain in left hip: Secondary | ICD-10-CM | POA: Diagnosis not present

## 2024-02-08 DIAGNOSIS — M9905 Segmental and somatic dysfunction of pelvic region: Secondary | ICD-10-CM | POA: Diagnosis not present

## 2024-02-12 DIAGNOSIS — M5116 Intervertebral disc disorders with radiculopathy, lumbar region: Secondary | ICD-10-CM | POA: Diagnosis not present

## 2024-02-12 DIAGNOSIS — M9905 Segmental and somatic dysfunction of pelvic region: Secondary | ICD-10-CM | POA: Diagnosis not present

## 2024-02-12 DIAGNOSIS — M25552 Pain in left hip: Secondary | ICD-10-CM | POA: Diagnosis not present

## 2024-02-12 DIAGNOSIS — M9903 Segmental and somatic dysfunction of lumbar region: Secondary | ICD-10-CM | POA: Diagnosis not present

## 2024-02-15 DIAGNOSIS — M25552 Pain in left hip: Secondary | ICD-10-CM | POA: Diagnosis not present

## 2024-02-15 DIAGNOSIS — M9905 Segmental and somatic dysfunction of pelvic region: Secondary | ICD-10-CM | POA: Diagnosis not present

## 2024-02-15 DIAGNOSIS — M5116 Intervertebral disc disorders with radiculopathy, lumbar region: Secondary | ICD-10-CM | POA: Diagnosis not present

## 2024-02-15 DIAGNOSIS — M9903 Segmental and somatic dysfunction of lumbar region: Secondary | ICD-10-CM | POA: Diagnosis not present

## 2024-02-20 DIAGNOSIS — M9905 Segmental and somatic dysfunction of pelvic region: Secondary | ICD-10-CM | POA: Diagnosis not present

## 2024-02-20 DIAGNOSIS — M5116 Intervertebral disc disorders with radiculopathy, lumbar region: Secondary | ICD-10-CM | POA: Diagnosis not present

## 2024-02-20 DIAGNOSIS — M9903 Segmental and somatic dysfunction of lumbar region: Secondary | ICD-10-CM | POA: Diagnosis not present

## 2024-02-20 DIAGNOSIS — M25552 Pain in left hip: Secondary | ICD-10-CM | POA: Diagnosis not present

## 2024-02-22 DIAGNOSIS — M9905 Segmental and somatic dysfunction of pelvic region: Secondary | ICD-10-CM | POA: Diagnosis not present

## 2024-02-22 DIAGNOSIS — M25552 Pain in left hip: Secondary | ICD-10-CM | POA: Diagnosis not present

## 2024-02-22 DIAGNOSIS — M5116 Intervertebral disc disorders with radiculopathy, lumbar region: Secondary | ICD-10-CM | POA: Diagnosis not present

## 2024-02-22 DIAGNOSIS — M9903 Segmental and somatic dysfunction of lumbar region: Secondary | ICD-10-CM | POA: Diagnosis not present

## 2024-02-29 DIAGNOSIS — M25552 Pain in left hip: Secondary | ICD-10-CM | POA: Diagnosis not present

## 2024-02-29 DIAGNOSIS — M5116 Intervertebral disc disorders with radiculopathy, lumbar region: Secondary | ICD-10-CM | POA: Diagnosis not present

## 2024-02-29 DIAGNOSIS — M9903 Segmental and somatic dysfunction of lumbar region: Secondary | ICD-10-CM | POA: Diagnosis not present

## 2024-02-29 DIAGNOSIS — M9905 Segmental and somatic dysfunction of pelvic region: Secondary | ICD-10-CM | POA: Diagnosis not present

## 2024-03-07 DIAGNOSIS — M5116 Intervertebral disc disorders with radiculopathy, lumbar region: Secondary | ICD-10-CM | POA: Diagnosis not present

## 2024-03-07 DIAGNOSIS — M9905 Segmental and somatic dysfunction of pelvic region: Secondary | ICD-10-CM | POA: Diagnosis not present

## 2024-03-07 DIAGNOSIS — M25552 Pain in left hip: Secondary | ICD-10-CM | POA: Diagnosis not present

## 2024-03-07 DIAGNOSIS — M9903 Segmental and somatic dysfunction of lumbar region: Secondary | ICD-10-CM | POA: Diagnosis not present

## 2024-03-18 DIAGNOSIS — M25552 Pain in left hip: Secondary | ICD-10-CM | POA: Diagnosis not present

## 2024-03-18 DIAGNOSIS — M9903 Segmental and somatic dysfunction of lumbar region: Secondary | ICD-10-CM | POA: Diagnosis not present

## 2024-03-18 DIAGNOSIS — M9905 Segmental and somatic dysfunction of pelvic region: Secondary | ICD-10-CM | POA: Diagnosis not present

## 2024-03-18 DIAGNOSIS — M5116 Intervertebral disc disorders with radiculopathy, lumbar region: Secondary | ICD-10-CM | POA: Diagnosis not present

## 2024-04-09 DIAGNOSIS — M810 Age-related osteoporosis without current pathological fracture: Secondary | ICD-10-CM | POA: Diagnosis not present

## 2024-04-29 DIAGNOSIS — M48061 Spinal stenosis, lumbar region without neurogenic claudication: Secondary | ICD-10-CM | POA: Diagnosis not present

## 2024-04-29 DIAGNOSIS — M5459 Other low back pain: Secondary | ICD-10-CM | POA: Diagnosis not present

## 2024-04-29 DIAGNOSIS — M51369 Other intervertebral disc degeneration, lumbar region without mention of lumbar back pain or lower extremity pain: Secondary | ICD-10-CM | POA: Diagnosis not present

## 2024-04-29 DIAGNOSIS — M549 Dorsalgia, unspecified: Secondary | ICD-10-CM | POA: Diagnosis not present

## 2024-04-29 DIAGNOSIS — M5416 Radiculopathy, lumbar region: Secondary | ICD-10-CM | POA: Diagnosis not present

## 2024-04-30 DIAGNOSIS — L814 Other melanin hyperpigmentation: Secondary | ICD-10-CM | POA: Diagnosis not present

## 2024-04-30 DIAGNOSIS — L821 Other seborrheic keratosis: Secondary | ICD-10-CM | POA: Diagnosis not present

## 2024-04-30 DIAGNOSIS — L308 Other specified dermatitis: Secondary | ICD-10-CM | POA: Diagnosis not present

## 2024-04-30 DIAGNOSIS — L111 Transient acantholytic dermatosis [Grover]: Secondary | ICD-10-CM | POA: Diagnosis not present

## 2024-05-06 DIAGNOSIS — M810 Age-related osteoporosis without current pathological fracture: Secondary | ICD-10-CM | POA: Diagnosis not present

## 2024-05-23 DIAGNOSIS — M5416 Radiculopathy, lumbar region: Secondary | ICD-10-CM | POA: Diagnosis not present

## 2024-06-09 DIAGNOSIS — M7918 Myalgia, other site: Secondary | ICD-10-CM | POA: Diagnosis not present

## 2024-06-09 DIAGNOSIS — M26622 Arthralgia of left temporomandibular joint: Secondary | ICD-10-CM | POA: Diagnosis not present

## 2024-07-08 DIAGNOSIS — H40003 Preglaucoma, unspecified, bilateral: Secondary | ICD-10-CM | POA: Diagnosis not present

## 2024-07-15 DIAGNOSIS — Z1211 Encounter for screening for malignant neoplasm of colon: Secondary | ICD-10-CM | POA: Diagnosis not present

## 2024-07-16 DIAGNOSIS — M5416 Radiculopathy, lumbar region: Secondary | ICD-10-CM | POA: Diagnosis not present

## 2024-08-30 DIAGNOSIS — M5417 Radiculopathy, lumbosacral region: Secondary | ICD-10-CM | POA: Diagnosis not present

## 2024-08-30 DIAGNOSIS — M4807 Spinal stenosis, lumbosacral region: Secondary | ICD-10-CM | POA: Diagnosis not present

## 2024-08-30 DIAGNOSIS — M4125 Other idiopathic scoliosis, thoracolumbar region: Secondary | ICD-10-CM | POA: Diagnosis not present

## 2024-09-09 ENCOUNTER — Other Ambulatory Visit: Payer: Self-pay | Admitting: Obstetrics and Gynecology

## 2024-09-09 DIAGNOSIS — Z1231 Encounter for screening mammogram for malignant neoplasm of breast: Secondary | ICD-10-CM

## 2024-09-30 DIAGNOSIS — R1013 Epigastric pain: Secondary | ICD-10-CM | POA: Diagnosis not present

## 2024-09-30 DIAGNOSIS — R140 Abdominal distension (gaseous): Secondary | ICD-10-CM | POA: Diagnosis not present

## 2024-10-01 DIAGNOSIS — Z01419 Encounter for gynecological examination (general) (routine) without abnormal findings: Secondary | ICD-10-CM | POA: Diagnosis not present

## 2024-10-01 DIAGNOSIS — Z6820 Body mass index (BMI) 20.0-20.9, adult: Secondary | ICD-10-CM | POA: Diagnosis not present

## 2024-10-01 DIAGNOSIS — M810 Age-related osteoporosis without current pathological fracture: Secondary | ICD-10-CM | POA: Diagnosis not present

## 2024-10-01 DIAGNOSIS — Z124 Encounter for screening for malignant neoplasm of cervix: Secondary | ICD-10-CM | POA: Diagnosis not present

## 2024-10-01 DIAGNOSIS — N952 Postmenopausal atrophic vaginitis: Secondary | ICD-10-CM | POA: Diagnosis not present

## 2024-10-03 ENCOUNTER — Ambulatory Visit
Admission: RE | Admit: 2024-10-03 | Discharge: 2024-10-03 | Disposition: A | Discharge status: Home / Self Care | Source: Ambulatory Visit | Attending: Obstetrics and Gynecology | Admitting: Obstetrics and Gynecology

## 2024-10-03 DIAGNOSIS — Z1231 Encounter for screening mammogram for malignant neoplasm of breast: Secondary | ICD-10-CM

## 2024-10-17 DIAGNOSIS — Z9889 Other specified postprocedural states: Secondary | ICD-10-CM | POA: Diagnosis not present

## 2024-10-17 DIAGNOSIS — R1013 Epigastric pain: Secondary | ICD-10-CM | POA: Diagnosis not present

## 2024-10-17 DIAGNOSIS — K2100 Gastro-esophageal reflux disease with esophagitis, without bleeding: Secondary | ICD-10-CM | POA: Diagnosis not present

## 2024-10-17 DIAGNOSIS — K319 Disease of stomach and duodenum, unspecified: Secondary | ICD-10-CM | POA: Diagnosis not present

## 2024-10-23 DIAGNOSIS — Z79899 Other long term (current) drug therapy: Secondary | ICD-10-CM | POA: Diagnosis not present

## 2024-10-23 DIAGNOSIS — M810 Age-related osteoporosis without current pathological fracture: Secondary | ICD-10-CM | POA: Diagnosis not present

## 2024-10-23 DIAGNOSIS — I10 Essential (primary) hypertension: Secondary | ICD-10-CM | POA: Diagnosis not present

## 2024-10-28 DIAGNOSIS — M41122 Adolescent idiopathic scoliosis, cervical region: Secondary | ICD-10-CM | POA: Diagnosis not present

## 2024-10-28 DIAGNOSIS — G4700 Insomnia, unspecified: Secondary | ICD-10-CM | POA: Diagnosis not present

## 2024-10-28 DIAGNOSIS — I10 Essential (primary) hypertension: Secondary | ICD-10-CM | POA: Diagnosis not present

## 2024-10-28 DIAGNOSIS — K2900 Acute gastritis without bleeding: Secondary | ICD-10-CM | POA: Diagnosis not present

## 2024-11-07 DIAGNOSIS — M810 Age-related osteoporosis without current pathological fracture: Secondary | ICD-10-CM | POA: Diagnosis not present

## 2024-11-11 DIAGNOSIS — K219 Gastro-esophageal reflux disease without esophagitis: Secondary | ICD-10-CM | POA: Diagnosis not present

## 2024-11-26 DIAGNOSIS — M47816 Spondylosis without myelopathy or radiculopathy, lumbar region: Secondary | ICD-10-CM | POA: Diagnosis not present
